# Patient Record
Sex: Female | Born: 1990 | Race: Black or African American | Hispanic: No | Marital: Single | Smoking: Never smoker
Health system: Southern US, Community
[De-identification: ages and names within clinical notes are randomized; demographics above are authoritative.]

## PROBLEM LIST (undated history)

## (undated) DIAGNOSIS — I493 Ventricular premature depolarization: Secondary | ICD-10-CM

## (undated) DIAGNOSIS — K219 Gastro-esophageal reflux disease without esophagitis: Secondary | ICD-10-CM

## (undated) DIAGNOSIS — L309 Dermatitis, unspecified: Secondary | ICD-10-CM

## (undated) DIAGNOSIS — J3501 Chronic tonsillitis: Secondary | ICD-10-CM

## (undated) HISTORY — DX: Gastro-esophageal reflux disease without esophagitis: K21.9

## (undated) HISTORY — PX: WISDOM TOOTH EXTRACTION: SHX21

---
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
## 2011-02-18 ENCOUNTER — Ambulatory Visit (INDEPENDENT_AMBULATORY_CARE_PROVIDER_SITE_OTHER): Payer: BC Managed Care – PPO

## 2011-02-18 DIAGNOSIS — N390 Urinary tract infection, site not specified: Secondary | ICD-10-CM

## 2011-05-15 ENCOUNTER — Encounter: Payer: Self-pay | Admitting: Cardiology

## 2011-05-15 ENCOUNTER — Encounter: Payer: Self-pay | Admitting: Internal Medicine

## 2011-05-23 ENCOUNTER — Other Ambulatory Visit (HOSPITAL_COMMUNITY): Payer: Self-pay | Admitting: Cardiology

## 2011-05-23 DIAGNOSIS — R002 Palpitations: Secondary | ICD-10-CM

## 2011-05-23 DIAGNOSIS — I493 Ventricular premature depolarization: Secondary | ICD-10-CM

## 2011-05-26 ENCOUNTER — Encounter: Payer: Self-pay | Admitting: Internal Medicine

## 2011-05-26 ENCOUNTER — Ambulatory Visit (INDEPENDENT_AMBULATORY_CARE_PROVIDER_SITE_OTHER): Payer: Self-pay | Admitting: Internal Medicine

## 2011-05-26 VITALS — BP 102/70 | HR 86 | Resp 18 | Ht 68.0 in | Wt 159.1 lb

## 2011-05-26 DIAGNOSIS — I493 Ventricular premature depolarization: Secondary | ICD-10-CM | POA: Insufficient documentation

## 2011-05-26 DIAGNOSIS — I4949 Other premature depolarization: Secondary | ICD-10-CM

## 2011-05-26 MED ORDER — NADOLOL 20 MG PO TABS: ORAL_TABLET | ORAL | Status: DC

## 2011-05-26 NOTE — Progress Notes (Signed)
Primary Care Physician: none Referring Physician:  Dr Nat Math is a 21 y.o. female with a h/o recently diagnosed PVCS who presents today for EP consultation.  She reports initially presenting to Dr [facility] office.  Upon being evaluated for tonsillectomy, she was observed to have frequent ventricular ectopy.  He referred her to Dr Mayford Knife.  EKG revealed LBBB inferior axis PVCs.  She had an echo which revealed no significant abnormalities.  She is mostly unaware of her PVCs. She reports rare palpitations at night.  Today, she denies symptoms of chest pain, shortness of breath, orthopnea, PND, lower extremity edema, dizziness, presyncope, or syncope. The patient is tolerating medications without difficulties and is otherwise without complaint today.   Past Medical History  Diagnosis Date  . Premature ventricular beat    Past Surgical History  Procedure Date  . Wisdom tooth extraction     No current outpatient prescriptions on file.    No Known Allergies  History   Social History  . Marital Status: Single    Spouse Name: N/A    Number of Children: N/A  . Years of Education: N/A   Occupational History  . Not on file.   Social History Main Topics  . Smoking status: Never Smoker   . Smokeless tobacco: Not on file  . Alcohol Use: No  . Drug Use: No  . Sexually Active: Not on file   Other Topics Concern  . Not on file   Social History Narrative   Pt lives in [place] with roommates.  Attends [employer] (sophomore).     Family History  Problem Relation Age of Onset  . Sudden death Cousin     maternal cousin had mental retardation, cardiomyopathy and sudden death  She is unaware of any other sudden death or arrhythmias though her paternal history is limited.  ROS- All systems are reviewed and negative except as per the HPI above  Physical Exam: Filed Vitals:   05/26/11 1016  BP: 102/70  Pulse: 86  Resp: 18  Height: 5\' 8"  (1.727 m)  Weight: 159 lb  1.9 oz (72.176 kg)  SpO2: 98%    GEN- The patient is well appearing, alert and oriented x 3 today.   Head- normocephalic, atraumatic Eyes-  Sclera clear, conjunctiva pink Ears- hearing intact Oropharynx- clear Neck- supple, no JVP Lymph- no cervical lymphadenopathy Lungs- Clear to ausculation bilaterally, normal work of breathing Heart- Regular rate and rhythm, no murmurs, rubs or gallops, PMI not laterally displaced GI- soft, NT, ND, + BS Extremities- no clubbing, cyanosis, or edema MS- no significant deformity or atrophy Skin- no rash or lesion Psych- euthymic mood, full affect Neuro- strength and sensation are intact  EKG today reveals sinus rhythm 74 bpm, rsR' otherwise normal ekg, no PVCs today Echo 05/15/11- EF 66%, normal LV/RV size and function No significant valvular disease  GXT 05/15/11- I have 3 ekgs from her GXT which reveal pre-exercise, exercise, and post-exercise. Pre and post exercise, she has sinus rhythm with LBB inferior Axis PVCs noted frequently.  She does not have any pvcs on the ekg during peak exercise and there are no ischemic changes  24 hour holter 05/19/11- sinus rhythm with frequent PVCs (22,501 pvcs), no sustained arrhythmias  Assessment and Plan:

## 2011-05-26 NOTE — Patient Instructions (Signed)
Your physician recommends that you schedule a follow-up appointment in: 6 weeks with Dr Johney Frame   Your physician has recommended you make the following change in your medication:  1) Start Nadolol 20mg  ---take 1/2 tablet daily

## 2011-05-26 NOTE — Assessment & Plan Note (Signed)
Ms Rehm presents today for EP consultation regarding asymptomatic PVCs.  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  There are not closely coupled to preceding t waves and she has never had presyncope, syncope, or sustained ventricular arrhythmias.  Echo is reviewed and reveals no structural heart disease.  EKG is benign.   She has had a maternal cousin who died suddenly, but with a h/o cardiomyopathy and mental retardation.  She is unaware of any other family history of arrhythmias or sudden death. At this point, I have asked the patient and her mother to try to obtain records from her cousin's autopsy for me to review. In the interim, I will initiate nadolol 10mg  daily.  I have encouraged her to avoid caffeine.  She will contact my office if syncope occurs or she develops symptoms with her PVCs.  I have recommended that she proceed with tonsillectomy if medically indicated without further CV workup.  If possible, I would avoid epinephrine with the procedure, though if absolutely necessary then epi is not an absolute contraindication.  I will see her again in 4 weeks for follow-up. She will contact me if problems arise.

## 2011-05-28 DIAGNOSIS — J3501 Chronic tonsillitis: Secondary | ICD-10-CM

## 2011-05-28 HISTORY — DX: Chronic tonsillitis: J35.01

## 2011-05-29 NOTE — H&P (Signed)
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  When there are present, she feels like there is a foreign body in her pharynx.  She sometimes can work them loose, sometimes they come out spontaneously.  Minimal bleeding.  She does have halitosis.  She also has frequent but minor sore throats.  In years past she had occasional strep throat.  PMH: Past Medical History  Diagnosis Date  . Premature ventricular beat     Surg Hx: Past Surgical History  Procedure Date  . Wisdom tooth extraction     FHx:   Family History  Problem Relation Age of Onset  . Sudden death Cousin     maternal cousin had mental retardation, cardiomyopathy and sudden death   SocHx:  reports that she has never smoked. She does not have any smokeless tobacco history on file. She reports that she does not drink alcohol or use illicit drugs.  ALLERGIES: No Known Allergies  No prescriptions prior to admission    No results found for this or any previous visit (from the past 48 hour(s)). No results found.  KGM:[id number]: Feeling tired (fatigue).  No fever, no night sweats, and no recent weight loss. Head: No headache. Eyes: No eye symptoms. Otolaryngeal: No hearing loss, no earache, no tinnitus, and no purulent nasal discharge.  No nasal passage blockage, no snoring, no sneezing, and no hoarseness.  Sore throat. Cardiovascular: No chest pain or discomfort  And Positive for  palpitations. Pulmonary: No dyspnea, no cough, and no wheezing. Gastrointestinal: No dysphagia, no heartburn, no nausea, no abdominal pain, and no melena.  No diarrhea. Genitourinary: No dysuria. Endocrine: No muscle weakness. Musculoskeletal: No calf muscle cramps, no arthralgias, and no soft tissue swelling. Neurological: No dizziness, no fainting, no tingling, and no numbness. Psychological: No anxiety   and no depression. Skin: No rash.   Vital Signs:  BP:106/64,  HR: 63 b/min,  Height: 68.000000 in, Weight: 664.403474 lb, BMI: 23.6 kg/m2,   PHYSICAL EXAM: She is healthy.  Some halitosis.  Mental status is appropriate.  She hears well in conversational speech.  Voice is clear and respirations unlabored through the nose.  The head is atraumatic and neck supple.  Cranial nerves intact.  Ear canals are clear with normal drums.  Anterior nose is moist and patent.  Oral cavity reveals broad mandible and maxilla with excellent teeth and no crowding.  Oropharynx reveals normal soft palate with 2-3+ tonsils.  No visible cryptic debris.  No velopharyngeal insufficiency.  I did not examine nasopharynx or hypopharynx.  Neck without adenopathy.  Lungs are clear to auscultation.   Heart reveals regularly irregular rhythm with a skipped beat every 3rd or 4th beat.   Abdomen is soft and active.   Extremities of normal configuration.   Neurologic testing grossly intact and symmetric.  Studies Reviewed:  Cardiology eval revealed frequent PVC's.  Cleared for surgery.  No epinephrine if possible.    Assessment/Plan . Chronic tonsillitis   (474.00) . Tonsillar calculus   (474.8)  I recommend tonsillectomy, possible adenoidectomy for tonsillolith formation, halitosis, and recurrent minor sore throats.  I talked about the surgery in basic details including risks and complications so she can make in informed consent but also so she can pick an appropriate time in her schedule.  I do think she should get each sore throat cultured to make sure we are not  missing strep.  I will see her back preoperatively.  Roxicet 5-325 MG/5ML Oral Solution;5-10 ml q 4-6h prn severe pain; Qty300; R0; Rx. Hydrocodone-Acetaminophen 7.5-500 MG/15ML Oral Solution;10-20 ml q4-6h prn pain; Qty500; R2; Rx.  Lazarus Salines, Tashena Ibach 05/29/2011, 6:03 PM

## 2011-05-30 ENCOUNTER — Encounter (HOSPITAL_BASED_OUTPATIENT_CLINIC_OR_DEPARTMENT_OTHER)
Admission: RE | Admit: 2011-05-30 | Discharge: 2011-05-30 | Disposition: A | Discharge status: Home / Self Care | Payer: BC Managed Care – PPO | Source: Ambulatory Visit | Attending: Otolaryngology | Admitting: Otolaryngology

## 2011-05-30 ENCOUNTER — Encounter (HOSPITAL_BASED_OUTPATIENT_CLINIC_OR_DEPARTMENT_OTHER): Payer: Self-pay | Admitting: *Deleted

## 2011-05-30 LAB — BASIC METABOLIC PANEL
CO2: 28 mEq/L (ref 19–32)
Calcium: 9.7 mg/dL (ref 8.4–10.5)
Creatinine, Ser: 0.8 mg/dL (ref 0.50–1.10)
GFR calc Af Amer: >90 mL/min (ref >90)
GFR calc non Af Amer: >90 mL/min (ref >90)
Sodium: 136 mEq/L (ref 135–145)

## 2011-05-30 NOTE — Pre-Procedure Instructions (Signed)
Check LMP DOS - unknown by mother

## 2011-05-30 NOTE — Pre-Procedure Instructions (Signed)
To come for BMET 

## 2011-06-02 ENCOUNTER — Ambulatory Visit (HOSPITAL_BASED_OUTPATIENT_CLINIC_OR_DEPARTMENT_OTHER)
Admission: RE | Admit: 2011-06-02 | Discharge: 2011-06-03 | Disposition: A | Discharge status: Home / Self Care | Payer: BC Managed Care – PPO | Source: Ambulatory Visit | Attending: Otolaryngology | Admitting: Otolaryngology

## 2011-06-02 ENCOUNTER — Ambulatory Visit (HOSPITAL_BASED_OUTPATIENT_CLINIC_OR_DEPARTMENT_OTHER): Payer: BC Managed Care – PPO | Admitting: Certified Registered"

## 2011-06-02 ENCOUNTER — Encounter (HOSPITAL_BASED_OUTPATIENT_CLINIC_OR_DEPARTMENT_OTHER)
Admission: RE | Disposition: A | Discharge status: Home / Self Care | Payer: Self-pay | Source: Ambulatory Visit | Attending: Otolaryngology

## 2011-06-02 ENCOUNTER — Institutional Professional Consult (permissible substitution): Payer: Self-pay | Admitting: Cardiology

## 2011-06-02 ENCOUNTER — Encounter (HOSPITAL_BASED_OUTPATIENT_CLINIC_OR_DEPARTMENT_OTHER): Payer: Self-pay | Admitting: Certified Registered"

## 2011-06-02 DIAGNOSIS — J3503 Chronic tonsillitis and adenoiditis: Secondary | ICD-10-CM

## 2011-06-02 DIAGNOSIS — Z01812 Encounter for preprocedural laboratory examination: Secondary | ICD-10-CM | POA: Insufficient documentation

## 2011-06-02 DIAGNOSIS — J039 Acute tonsillitis, unspecified: Secondary | ICD-10-CM | POA: Insufficient documentation

## 2011-06-02 HISTORY — DX: Chronic tonsillitis: J35.01

## 2011-06-02 HISTORY — PX: TONSILLECTOMY AND ADENOIDECTOMY: SHX28

## 2011-06-02 HISTORY — DX: Ventricular premature depolarization: I49.3

## 2011-06-02 HISTORY — DX: Dermatitis, unspecified: L30.9

## 2011-06-02 SURGERY — TONSILLECTOMY AND ADENOIDECTOMY
Anesthesia: General | Site: Throat | Wound class: Clean Contaminated

## 2011-06-02 MED ORDER — SUCCINYLCHOLINE CHLORIDE 20 MG/ML IJ SOLN
INTRAMUSCULAR | Status: DC | PRN
  Administered 2011-06-02: 100 mg via INTRAVENOUS

## 2011-06-02 MED ORDER — MIDAZOLAM HCL 5 MG/5ML IJ SOLN
INTRAMUSCULAR | Status: DC | PRN
  Administered 2011-06-02: 2 mg via INTRAVENOUS

## 2011-06-02 MED ORDER — DEXAMETHASONE SODIUM PHOSPHATE 4 MG/ML IJ SOLN
8.0000 mg | Freq: Once | INTRAMUSCULAR | Status: AC
  Administered 2011-06-02: 8 mg via INTRAVENOUS

## 2011-06-02 MED ORDER — ONDANSETRON HCL 4 MG/2ML IJ SOLN
4.0000 mg | INTRAMUSCULAR | Status: DC | PRN
  Administered 2011-06-02: 4 mg via INTRAVENOUS

## 2011-06-02 MED ORDER — GLYCOPYRROLATE 0.2 MG/ML IJ SOLN
INTRAMUSCULAR | Status: DC | PRN
  Administered 2011-06-02: 0.1 mg via INTRAVENOUS

## 2011-06-02 MED ORDER — POVIDONE-IODINE 10 % EX SOLN
CUTANEOUS | Status: DC | PRN
  Administered 2011-06-02: 1 via TOPICAL

## 2011-06-02 MED ORDER — HYDROMORPHONE HCL PF 1 MG/ML IJ SOLN
0.2500 mg | INTRAMUSCULAR | Status: DC | PRN
  Administered 2011-06-02 (×5): 0.5 mg via INTRAVENOUS
  Administered 2011-06-02: 1 mg via INTRAVENOUS

## 2011-06-02 MED ORDER — PROPOFOL 10 MG/ML IV EMUL
INTRAVENOUS | Status: DC | PRN
  Administered 2011-06-02: 200 mg via INTRAVENOUS

## 2011-06-02 MED ORDER — SODIUM CHLORIDE 0.9 % IV SOLN
INTRAVENOUS | Status: DC
  Administered 2011-06-02 – 2011-06-03 (×3): via INTRAVENOUS

## 2011-06-02 MED ORDER — OXYCODONE-ACETAMINOPHEN 5-325 MG/5ML PO SOLN
10.0000 mL | ORAL | Status: AC | PRN
  Administered 2011-06-02: 10 mL via ORAL

## 2011-06-02 MED ORDER — LACTATED RINGERS IV SOLN
INTRAVENOUS | Status: DC
  Administered 2011-06-02: 10:00:00 via INTRAVENOUS

## 2011-06-02 MED ORDER — ONDANSETRON HCL 4 MG/2ML IJ SOLN
4.0000 mg | Freq: Once | INTRAMUSCULAR | Status: AC | PRN
  Administered 2011-06-02: 4 mg via INTRAVENOUS

## 2011-06-02 MED ORDER — LIDOCAINE HCL (CARDIAC) 20 MG/ML IV SOLN
INTRAVENOUS | Status: DC | PRN
  Administered 2011-06-02: 60 mg via INTRAVENOUS
  Administered 2011-06-02: 30 mg via INTRAVENOUS

## 2011-06-02 MED ORDER — DEXAMETHASONE SODIUM PHOSPHATE 10 MG/ML IJ SOLN: 8.0000 mg | Freq: Once | INTRAMUSCULAR | Status: DC

## 2011-06-02 MED ORDER — SODIUM CHLORIDE 0.9 % IV SOLN
1.0000 g | Freq: Once | INTRAVENOUS | Status: AC
  Administered 2011-06-02: 1 g via INTRAVENOUS

## 2011-06-02 MED ORDER — HYDROCODONE-ACETAMINOPHEN 7.5-325 MG/15ML PO SOLN: 10.0000 mL | Freq: Four times a day (QID) | ORAL | Status: DC | PRN

## 2011-06-02 MED ORDER — ONDANSETRON HCL 4 MG/2ML IJ SOLN
INTRAMUSCULAR | Status: DC | PRN
  Administered 2011-06-02: 4 mg via INTRAVENOUS

## 2011-06-02 MED ORDER — OXYCODONE-ACETAMINOPHEN 5-325 MG/5ML PO SOLN
5.0000 mL | ORAL | Status: DC | PRN
  Administered 2011-06-02 (×2): 10 mL via ORAL

## 2011-06-02 MED ORDER — ONDANSETRON HCL 4 MG PO TABS: 4.0000 mg | ORAL_TABLET | ORAL | Status: DC | PRN

## 2011-06-02 MED ORDER — OXYCODONE HCL 5 MG/5ML PO SOLN
5.0000 mg | ORAL | Status: DC | PRN
  Administered 2011-06-03 (×2): 10 mg via ORAL

## 2011-06-02 MED ORDER — DEXAMETHASONE SODIUM PHOSPHATE 4 MG/ML IJ SOLN
INTRAMUSCULAR | Status: DC | PRN
  Administered 2011-06-02: 10 mg via INTRAVENOUS

## 2011-06-02 MED ORDER — FENTANYL CITRATE 0.05 MG/ML IJ SOLN
INTRAMUSCULAR | Status: DC | PRN
  Administered 2011-06-02: 100 ug via INTRAVENOUS
  Administered 2011-06-02: 50 ug via INTRAVENOUS

## 2011-06-02 SURGICAL SUPPLY — 35 items
CANISTER SUCTION 1200CC (MISCELLANEOUS) ×2 IMPLANT
CATH ROBINSON RED A/P 10FR (CATHETERS) ×1 IMPLANT
CLEANER CAUTERY TIP 5X5 PAD (MISCELLANEOUS) ×1 IMPLANT
CLOTH BEACON ORANGE TIMEOUT ST (SAFETY) ×2 IMPLANT
COAGULATOR SUCT SWTCH 10FR 6 (ELECTROSURGICAL) ×1 IMPLANT
COVER MAYO STAND STRL (DRAPES) ×2 IMPLANT
DECANTER SPIKE VIAL GLASS SM (MISCELLANEOUS) IMPLANT
ELECT COATED BLADE 2.86 ST (ELECTRODE) ×2 IMPLANT
ELECT REM PT RETURN 9FT ADLT (ELECTROSURGICAL) ×2
ELECT REM PT RETURN 9FT PED (ELECTROSURGICAL)
ELECTRODE REM PT RETRN 9FT PED (ELECTROSURGICAL) IMPLANT
ELECTRODE REM PT RTRN 9FT ADLT (ELECTROSURGICAL) IMPLANT
GAUZE SPONGE 4X4 12PLY STRL LF (GAUZE/BANDAGES/DRESSINGS) ×2 IMPLANT
GLOVE BIO SURGEON STRL SZ 6.5 (GLOVE) ×1 IMPLANT
GLOVE ECLIPSE 8.0 STRL XLNG CF (GLOVE) ×2 IMPLANT
GLOVE SURG SS PI 7.0 STRL IVOR (GLOVE) IMPLANT
GOWN PREVENTION PLUS XLARGE (GOWN DISPOSABLE) ×2 IMPLANT
GOWN PREVENTION PLUS XXLARGE (GOWN DISPOSABLE) ×2 IMPLANT
MARKER SKIN DUAL TIP RULER LAB (MISCELLANEOUS) ×2 IMPLANT
NDL SPNL 22GX3.5 QUINCKE BK (NEEDLE) ×1 IMPLANT
NEEDLE SPNL 22GX3.5 QUINCKE BK (NEEDLE) IMPLANT
NS IRRIG 1000ML POUR BTL (IV SOLUTION) ×2 IMPLANT
PAD CLEANER CAUTERY TIP 5X5 (MISCELLANEOUS) ×1
PENCIL FOOT CONTROL (ELECTRODE) ×2 IMPLANT
SHEET MEDIUM DRAPE 40X70 STRL (DRAPES) ×2 IMPLANT
SLEEVE SCD COMPRESS KNEE MED (MISCELLANEOUS) ×1 IMPLANT
SPONGE TONSIL 1 RF SGL (DISPOSABLE) IMPLANT
SPONGE TONSIL 1.25 RF SGL STRG (GAUZE/BANDAGES/DRESSINGS) IMPLANT
SYR BULB 3OZ (MISCELLANEOUS) ×2 IMPLANT
SYR CONTROL 10ML LL (SYRINGE) ×1 IMPLANT
TOWEL OR 17X24 6PK STRL BLUE (TOWEL DISPOSABLE) ×2 IMPLANT
TUBE CONNECTING 20X1/4 (TUBING) ×2 IMPLANT
TUBE SALEM SUMP 12R W/ARV (TUBING) ×1 IMPLANT
TUBE SALEM SUMP 16 FR W/ARV (TUBING) IMPLANT
WATER STERILE IRR 1000ML POUR (IV SOLUTION) ×1 IMPLANT

## 2011-06-02 NOTE — Anesthesia Postprocedure Evaluation (Signed)
  Anesthesia Post-op Note  Patient: Sharon Shaw  Procedure(s) Performed: Procedure(s) (LRB): TONSILLECTOMY AND ADENOIDECTOMY (N/A)  Patient Location: PACU  Anesthesia Type: General  Level of Consciousness: awake and alert   Airway and Oxygen Therapy: Patient Spontanous Breathing  Post-op Pain: moderate  Post-op Assessment: Post-op Vital signs reviewed, Patient's Cardiovascular Status Stable, Respiratory Function Stable, Patent Airway and No signs of Nausea or vomiting  Post-op Vital Signs: Reviewed and stable  Complications: No apparent anesthesia complications

## 2011-06-02 NOTE — Interval H&P Note (Signed)
History and Physical Interval Note:  06/02/2011 8:38 AM  Sharon Shaw  has presented today for surgery, with the diagnosis of chronic tonsillitis  The various methods of treatment have been discussed with the patient and family. After consideration of risks, benefits and other options for treatment, the patient has consented to  Procedure(s) (LRB): TONSILLECTOMY AND ADENOIDECTOMY (N/A) as a surgical intervention .  The patients' history has been re-reviewed, patient re-examined, no change in status, stable for surgery.  I have re-reviewed the patient's chart and labs.  Questions were answered to the patient's satisfaction.     Flo Shanks

## 2011-06-02 NOTE — Anesthesia Procedure Notes (Signed)
Procedure Name: Intubation Date/Time: 06/02/2011 10:14 AM Performed by: Verlan Friends Pre-anesthesia Checklist: Patient identified, Emergency Drugs available, Suction available, Patient being monitored and Timeout performed Patient Re-evaluated:Patient Re-evaluated prior to inductionOxygen Delivery Method: Circle System Utilized Preoxygenation: Pre-oxygenation with 100% oxygen Intubation Type: IV induction Ventilation: Mask ventilation without difficulty Laryngoscope Size: Miller and 3 Grade View: Grade I Tube type: Oral Tube size: 7.0 mm Number of attempts: 1 Airway Equipment and Method: stylet and oral airway Placement Confirmation: ETT inserted through vocal cords under direct vision,  positive ETCO2 and breath sounds checked- equal and bilateral Secured at: 21 cm Tube secured with: Tape Dental Injury: Teeth and Oropharynx as per pre-operative assessment

## 2011-06-02 NOTE — Progress Notes (Signed)
approximaterly 75 ml dark red liquid settled to bottom of emesis; no clots. Top 150 ml clear liquid w/smell of Roxicet medication.

## 2011-06-02 NOTE — Anesthesia Preprocedure Evaluation (Addendum)
Anesthesia Evaluation  Patient identified by MRN, date of birth, ID band Patient awake    Reviewed: Allergy & Precautions, H&P , NPO status , Patient's Chart, lab work & pertinent test results  Airway Mallampati: II      Dental  (+) Teeth Intact   Pulmonary  breath sounds clear to auscultation        Cardiovascular Rhythm:Regular Rate:Normal     Neuro/Psych    GI/Hepatic   Endo/Other    Renal/GU      Musculoskeletal   Abdominal   Peds  Hematology   Anesthesia Other Findings   Reproductive/Obstetrics                           Anesthesia Physical Anesthesia Plan  ASA: II  Anesthesia Plan: General   Post-op Pain Management:    Induction: Intravenous  Airway Management Planned: Oral ETT  Additional Equipment:   Intra-op Plan:   Post-operative Plan: Extubation in OR  Informed Consent: I have reviewed the patients History and Physical, chart, labs and discussed the procedure including the risks, benefits and alternatives for the proposed anesthesia with the patient or authorized representative who has indicated his/her understanding and acceptance.   Dental advisory given  Plan Discussed with: CRNA and Surgeon  Anesthesia Plan Comments: (Chronic tonsillitis with tonsillar stones H/O frequent PVCs, stress test and echo normal cleared for surgery by Dr. Loleta Dicker, MD)       Anesthesia Quick Evaluation

## 2011-06-02 NOTE — Progress Notes (Signed)
06/02/2011 5:55 PM  Ashaunte, [place] [id number]  Post op check    Temp:  [97.4 F (36.3 C)-98.1 F (36.7 C)] 97.8 F (36.6 C) (05/06 1630) Pulse Rate:  [60-106] 73  (05/06 1630) Resp:  [11-22] 16  (05/06 1630) BP: (106-131)/(63-90) 112/67 mmHg (05/06 1630) SpO2:  [92 %-100 %] 99 % (05/06 1719),     Intake/Output Summary (Last 24 hours) at 06/02/11 1755 Last data filed at 06/02/11 1630  Gross per 24 hour  Intake   1100 ml  Output    500 ml  Net    600 ml    Results for orders placed during the hospital encounter of 06/02/11 (from the past 24 hour(s))  POCT HEMOGLOBIN-HEMACUE     Status: Abnormal   Collection Time   06/02/11  9:37 AM      Component Value Range   Hemoglobin 11.9 (*) 12.0 - 15.0 (g/dL)    SUBJECTIVE:  Nurses report reluctant to take po's.  Emesis post oxycodone recently.  Tol. Dilaudid dose.  OBJECTIVE:  Sleeping, but arousable.  IMPRESSION:  Slow progress  PLAN:  Advance po.  Advance activity.  Repeat oxycodone dose after emesis.  Try add'l dose of Decadron 8 mg.  Anticipate nursing D/C in AM.  Lazarus Salines, Zola Button

## 2011-06-02 NOTE — Transfer of Care (Signed)
Immediate Anesthesia Transfer of Care Note  Patient: Sharon Shaw  Procedure(s) Performed: Procedure(s) (LRB): TONSILLECTOMY AND ADENOIDECTOMY (N/A)  Patient Location: PACU  Anesthesia Type: General  Level of Consciousness: awake, alert , oriented and patient cooperative  Airway & Oxygen Therapy: Patient Spontanous Breathing and Patient connected to face mask oxygen  Post-op Assessment: Report given to PACU RN and Post -op Vital signs reviewed and stable  Post vital signs: Reviewed and stable  Complications: No apparent anesthesia complications

## 2011-06-02 NOTE — Op Note (Signed)
06/02/2011  10:44 AM    Barnett Hatter  [id number]   Pre-Op Dx:  Recurrent adenotonsillitis  Post-op Dx: same  Proc: T&A   Surg:  Flo Shanks T MD  Anes:  GOT  EBL:  10 ml  Comp:  none  Findings:  Congested inferior turbinates.  Nl palate. 2-3+ tonsils.  50% residual adenoids.  Procedure:  With the patient in a comfortable supine position,  general orotracheal anesthesia was induced without difficulty.  At an appropriate level, the patient was turned 90 away from anesthesia and placed in Trendelenburg.  A clean preparation and draping was accomplished.  Taking care to protect lips, teeth, and endotracheal tube, the Crowe-Davis mouth gag was introduced, expanded for visualization, and suspended from the Mayo stand in the standard fashion.  The findings were as described above.  Palate  retractor  and mirror were used to examine the nasopharynx with the findings as described above.   Anterior nose was examined with a nasal speculum with the findings as described above.   A red rubber  Catheter was passed through the nose and out the mouth as a palate retractor.  Using the suction cautery, the adenoid pad was ablated.  Hemostasis was observed.  Beginning on the  RIGHT side, the tonsil was grasped and retracted medially.  The mucosa over the anterior and superior poles was coagulated and then cut down to the capsule of the tonsil.  Using the cautery tip as a blunt dissector, the tonsil was dissected from its muscular fossa from anterior to posterior and from superior to inferior.  Fibrous bands were lysed as necessary.  Crossing vessels were coagulated as identified.  The tonsil was removed in its entirety as determined by examination of both tonsil and fossa.  A small additional quantity of cautery rendered the fossa hemostatic.    After completing the 1st tonsillectomy, the 2nd one was performed in identical fashion.  After completing both tonsillectomies and rendering the  oropharynx hemostatic, the nasopharynx was re-inspected. Hemostasis was observed.      At this point the palate retractor and mouthgag were relaxed for several minutes.  Upon reexpansion,  Hemostasis was observed at all sites.  The palate retractor and mouth gag were relaxed and removed.  The dental status was intact.  At this point the procedure was completed.  The patient was returned to anesthesia, awakened, extubated, and transferred to recovery in stable condition.   Dispo:  OR to PACU.   Will observe for six hours, overnight if necessary and then discharged to home in care of family.  Plan:  Analgesia, hydration, limited activity for two weeks.  Advance diet as comfortable.  Return to school or work at 10 days.  Cephus Richer  MD.

## 2011-06-02 NOTE — Discharge Instructions (Signed)
Tonsillectomy, Information Before and After A tonsillectomy is a surgery to remove the tonsils. This is often done because non-surgical (conservative) treatments have failed to help the problem. This procedure is also sometimes done if a tonsil seems to be abnormally large for no reason. Tonsils are lumps of lymph tissues at the back of the throat. Because tonsils can collect debris, they can become infected. Tonsils help fight infections. Removal of the tonsils does not cause an increased risk of infections. LET YOUR CAREGIVER KNOW ABOUT:  Allergies to food or medicine.   Medicines taken, including vitamins, herbs, eyedrops, over-the-counter medicines, and creams.   Use of steroids (by mouth or creams).   Previous problems with anesthetics or numbing medicines.   History of bleeding problems or blood clots.   Previous surgery.   Other health problems, including diabetes and kidney problems.   Possibility of pregnancy, if this applies.  BEFORE THE PROCEDURE You should be present 60 minutes prior to your procedure or as directed.  AFTER THE PROCEDURE After surgery, you will be taken to a recovery area for close monitoring. Once you are awake, stable, and taking fluids well, you will be allowed to go home. Throat soreness may continue for 2 to 3 weeks. There also may be pain felt in the ear that causes an earache. HOME CARE INSTRUCTIONS   Only take over-the-counter or prescription medicines for pain, discomfort, or fever as directed by your caregiver. Do not take aspirin. This increases the possibilities for bleeding.   Obtain proper rest. You may feel worn out and tired for a while.   Because of the sore throat and swelling, your appetite may be poor. Soft and cold foods such as ice cream, frozen ice pops, and cold drinks are usually tolerated best.   Avoid mouthwashes and gargling.   Avoid people with upper respiratory infections, such as colds or sore throats.   An ice pack  applied to your neck may help with discomfort and keep swelling down.  SEEK MEDICAL CARE IF:   There is increased bleeding, if you vomit, or cough or spit up bright red blood.   Increasing pain that is not controlled with medications.   An unexplained oral temperature above 102 F (38.9 C) develops.   You feel lightheaded or have a fainting spell.  SEEK IMMEDIATE MEDICAL CARE IF:   You develop a rash.   You have difficulty breathing.   You have any other allergic problems.  Document Released: 04/27/2000 Document Revised: 01/02/2011 Document Reviewed: 11/30/2006 [facility] Patient Information 2012 [place], [place].    Post Anesthesia Home Care Instructions  Activity: Get plenty of rest for the remainder of the day. A responsible adult should stay with you for 24 hours following the procedure.  For the next 24 hours, DO NOT: -Drive a car -Advertising copywriter -Drink alcoholic beverages -Take any medication unless instructed by your physician -Make any legal decisions or sign important papers.  Meals: Start with liquid foods such as gelatin or soup. Progress to regular foods as tolerated. Avoid greasy, spicy, heavy foods. If nausea and/or vomiting occur, drink only clear liquids until the nausea and/or vomiting subsides. Call your physician if vomiting continues.  Special Instructions/Symptoms: Your throat may feel dry or sore from the anesthesia or the breathing tube placed in your throat during surgery. If this causes discomfort, gargle with warm salt water. The discomfort should disappear within 24 hours.    Call your surgeon if you experience:   1.  Fever over  101.0. 2.  Inability to urinate. 3.  Nausea and/or vomiting. 4.  Extreme swelling or bruising at the surgical site. 5.  Continued bleeding from the incision. 6.  Increased pain, redness or drainage from the incision. 7.  Problems related to your pain medication.

## 2011-06-03 ENCOUNTER — Inpatient Hospital Stay (HOSPITAL_COMMUNITY)
Admission: RE | Admit: 2011-06-03 | Discharge: 2011-06-03 | Payer: Self-pay | Source: Ambulatory Visit | Attending: Cardiology | Admitting: Cardiology

## 2011-06-03 ENCOUNTER — Encounter (HOSPITAL_BASED_OUTPATIENT_CLINIC_OR_DEPARTMENT_OTHER): Payer: Self-pay | Admitting: Otolaryngology

## 2011-06-04 ENCOUNTER — Emergency Department (HOSPITAL_COMMUNITY)
Admission: EM | Admit: 2011-06-04 | Discharge: 2011-06-04 | Disposition: A | Discharge status: Home / Self Care | Payer: BC Managed Care – PPO | Attending: Emergency Medicine | Admitting: Emergency Medicine

## 2011-06-04 ENCOUNTER — Emergency Department (HOSPITAL_COMMUNITY): Payer: BC Managed Care – PPO

## 2011-06-04 ENCOUNTER — Encounter (HOSPITAL_COMMUNITY): Payer: Self-pay | Admitting: *Deleted

## 2011-06-04 DIAGNOSIS — R0602 Shortness of breath: Secondary | ICD-10-CM | POA: Insufficient documentation

## 2011-06-04 DIAGNOSIS — R070 Pain in throat: Secondary | ICD-10-CM | POA: Insufficient documentation

## 2011-06-04 DIAGNOSIS — R110 Nausea: Secondary | ICD-10-CM | POA: Insufficient documentation

## 2011-06-04 DIAGNOSIS — G8918 Other acute postprocedural pain: Secondary | ICD-10-CM

## 2011-06-04 MED ORDER — IOHEXOL 300 MG/ML  SOLN
70.0000 mL | Freq: Once | INTRAMUSCULAR | Status: AC | PRN
  Administered 2011-06-04: 70 mL via INTRAVENOUS

## 2011-06-04 MED ORDER — MORPHINE SULFATE 4 MG/ML IJ SOLN
4.0000 mg | Freq: Once | INTRAMUSCULAR | Status: AC
  Administered 2011-06-04: 4 mg via INTRAVENOUS
  Filled 2011-06-04: qty 1

## 2011-06-04 MED ORDER — SODIUM CHLORIDE 0.9 % IV BOLUS (SEPSIS)
1000.0000 mL | Freq: Once | INTRAVENOUS | Status: AC
  Administered 2011-06-04: 1000 mL via INTRAVENOUS

## 2011-06-04 MED ORDER — DEXTROSE 5 % IV SOLN
1.0000 g | INTRAVENOUS | Status: DC
  Administered 2011-06-04: 1 g via INTRAVENOUS
  Filled 2011-06-04: qty 10
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
## 2011-06-04 NOTE — ED Notes (Signed)
PT returned from CT. No signs of distress; still refusing communication.

## 2011-06-04 NOTE — ED Notes (Signed)
Pt here for sob and nausea.  Had  Tonsils removed on 06/02/11.  Pt denies bleeding, but states difficulty swallowing, sob and nausea.  VS wnl.  Pt has called surgeon [facility] and he stated to come to ED.
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
## 2011-06-19 ENCOUNTER — Encounter: Payer: Self-pay | Admitting: Internal Medicine

## 2011-07-09 ENCOUNTER — Ambulatory Visit: Payer: Self-pay | Admitting: Internal Medicine

## 2011-07-23 ENCOUNTER — Encounter: Payer: Self-pay | Admitting: Internal Medicine

## 2011-08-05 ENCOUNTER — Ambulatory Visit: Payer: BC Managed Care – PPO

## 2011-08-05 ENCOUNTER — Ambulatory Visit (INDEPENDENT_AMBULATORY_CARE_PROVIDER_SITE_OTHER): Payer: BC Managed Care – PPO | Admitting: Family Medicine

## 2011-08-05 VITALS — BP 108/65 | HR 86 | Temp 97.9°F | Resp 16 | Ht 67.0 in | Wt 156.0 lb

## 2011-08-05 DIAGNOSIS — R112 Nausea with vomiting, unspecified: Secondary | ICD-10-CM

## 2011-08-05 DIAGNOSIS — R110 Nausea: Secondary | ICD-10-CM

## 2011-08-05 DIAGNOSIS — K219 Gastro-esophageal reflux disease without esophagitis: Secondary | ICD-10-CM

## 2011-08-05 LAB — POCT URINALYSIS DIPSTICK
Blood, UA: NEGATIVE
Glucose, UA: NEGATIVE
Nitrite, UA: NEGATIVE
Urobilinogen, UA: 0.2

## 2011-08-05 LAB — POCT UA - MICROSCOPIC ONLY
Casts, Ur, LPF, POC: NEGATIVE
Crystals, Ur, HPF, POC: NEGATIVE

## 2011-08-05 LAB — POCT URINE PREGNANCY: Preg Test, Ur: NEGATIVE

## 2011-08-05 MED ORDER — PROMETHAZINE HCL 12.5 MG PO TABS: 12.5000 mg | ORAL_TABLET | Freq: Three times a day (TID) | ORAL | Status: DC | PRN

## 2011-08-05 MED ORDER — ESOMEPRAZOLE MAGNESIUM 40 MG PO CPDR: 40.0000 mg | DELAYED_RELEASE_CAPSULE | Freq: Every day | ORAL | Status: DC

## 2011-08-05 MED ORDER — OMEPRAZOLE 20 MG PO CPDR: 20.0000 mg | DELAYED_RELEASE_CAPSULE | Freq: Every day | ORAL | Status: DC

## 2011-08-05 NOTE — Progress Notes (Signed)
Patient ID: Sharon Shaw, female   DOB: March 19, 1990, 21 y.o.   MRN: [id number] Sharon Shaw is a 21 y.o. female who presents to Urgent Care today for nausea and vomiting:  1.  N/V:  For past 3 weeks has been experiencing nausea after meals.  None today.  No abdominal pain.  No fevers or chills.  No actual vomiting, just "queasiness" that lasts for about 30 minutes and then resolves.  Currently sexually active but endorses condom use.  Not on birth control currently.  History of GERD 2 years ago, treated with Omeprazole, resolved.  No problems until about 3 weeks ago.    PMH reviewed.  ROS as above otherwise neg.  No chest pain, palpitations, SOB, Fever, Chills, Abd pain.  Medications reviewed. Current Outpatient Prescriptions  Medication Sig Dispense Refill  . nadolol (CORGARD) 20 MG tablet Take 10 mg by mouth daily.        Exam:  BP 108/65  Pulse 86  Temp 97.9 F (36.6 C)  Resp 16  Ht 5\' 7"  (1.702 m)  Wt 156 lb (70.761 kg)  BMI 24.43 kg/m2  LMP 07/26/2011 Gen: Well NAD HEENT: EOMI,  MMM Lungs: CTABL Nl WOB Heart: RRR no MRG Abd: NABS, NT, ND Exts: Non edematous BL  LE, warm and well perfused.   Results for orders placed in visit on 08/05/11  POCT URINE PREGNANCY      Component Value Range   Preg Test, Ur Negative    POCT URINALYSIS DIPSTICK      Component Value Range   Color, UA yellow     Clarity, UA clear     Glucose, UA neg     Bilirubin, UA neg     Ketones, UA neg     Spec Grav, UA 1.010     Blood, UA neg     pH, UA 5.5     Protein, UA neg     Urobilinogen, UA 0.2     Nitrite, UA neg     Leukocytes, UA Negative    POCT UA - MICROSCOPIC ONLY      Component Value Range   WBC, Ur, HPF, POC 1-3     RBC, urine, microscopic 0-2     Bacteria, U Microscopic trace     Mucus, UA neg     Epithelial cells, urine per micros 1-3     Crystals, Ur, HPF, POC neg     Casts, Ur, LPF, POC neg     Yeast, UA neg       Assessment and Plan:  1.  Nausea:   Likely GERD symptoms.  Plan to treat with Nexium as she is worried Omeprazole will no longer work.  Also provided prn prescription of Phenergan to use until Nexium has a chance to work.  No red flags on exam or by history.  Pregnancy test negative.  UA negative.  No abdomen pain, eating well, no anorexia, no signs/symptoms of acute abdominal process.  I provided the patient with explicit warnings and red flags that would prompt return to clinic or the ED.

## 2011-08-05 NOTE — Patient Instructions (Addendum)
Take the Omeprazole daily for the next several weeks to help with acid reduction.  Take the Phenergan if you have any bad nausea or vomiting.    If you haven't had any relief in next week or so, please come back and see [place].  If you have any fevers, chills, abdominal pain please come back or go to the ER.

## 2011-11-11 ENCOUNTER — Ambulatory Visit (INDEPENDENT_AMBULATORY_CARE_PROVIDER_SITE_OTHER): Payer: BC Managed Care – PPO | Admitting: Physician Assistant

## 2011-11-11 VITALS — BP 112/66 | HR 81 | Temp 97.7°F | Resp 16 | Ht 67.0 in | Wt 160.0 lb

## 2011-11-11 DIAGNOSIS — L293 Anogenital pruritus, unspecified: Secondary | ICD-10-CM

## 2011-11-11 DIAGNOSIS — N760 Acute vaginitis: Secondary | ICD-10-CM

## 2011-11-11 DIAGNOSIS — N898 Other specified noninflammatory disorders of vagina: Secondary | ICD-10-CM

## 2011-11-11 LAB — POCT WET PREP WITH KOH: Trichomonas, UA: NEGATIVE

## 2011-11-11 MED ORDER — FLUCONAZOLE 150 MG PO TABS: 150.0000 mg | ORAL_TABLET | Freq: Once | ORAL | Status: DC

## 2011-11-11 NOTE — Progress Notes (Signed)
  Subjective:    Patient ID: Sharon Shaw, female    DOB: [birth year], 21 y.o.   MRN: [id number]  HPI    Review of Systems     Objective:   Physical Exam        Assessment & Plan:  I was involved and agree with the above assessment and plan of this patient.

## 2011-11-11 NOTE — Progress Notes (Signed)
  Subjective:    Patient ID: Sharon Shaw, female    DOB: 12/14/1990, 21 y.o.   MRN: [id number]  HPI  Sharon Shaw is a 21 yr old female complaining of what she thinks is a yeast infection.  Has experienced vaginal itching since Sunday as well as thick white vaginal discharge.  She has had no urinary symptoms.  No recent antibiotics.  She is sexually active with one partner.  She is not using any form of contraception.  She has no concern for STI but would like [place] to test for gonorrhea and chlamydia today.  Period started today.     Review of Systems  All other systems reviewed and are negative.       Objective:   Physical Exam  Constitutional: She appears well-developed and well-nourished. No distress.  Cardiovascular: Normal rate, regular rhythm and normal heart sounds.   Pulmonary/Chest: Breath sounds normal.  Abdominal: Soft. Bowel sounds are normal. There is no tenderness. There is no rebound, no guarding and no CVA tenderness.  Genitourinary:       Labia normal, copious menstrual blood in the vagina, no discharge noted    Filed Vitals:   11/11/11 0846  BP: 112/66  Pulse: 81  Temp: 97.7 F (36.5 C)  Resp: 16   Results for orders placed in visit on 11/11/11  POCT WET PREP WITH KOH      Component Value Range   Trichomonas, UA Negative     Clue Cells Wet Prep HPF POC 0-1     Epithelial Wet Prep HPF POC 0-1     Yeast Wet Prep HPF POC positive     Bacteria Wet Prep HPF POC trace     RBC Wet Prep HPF POC TNTC     WBC Wet Prep HPF POC 3-8     KOH Prep POC Positive           Assessment & Plan:   1. Vaginitis  POCT Wet Prep with KOH, GC/chlamydia probe amp, genital, fluconazole (DIFLUCAN) 150 MG tablet  2. Vaginal itching    3. White Vaginal Discharge     Sharon Shaw is a 21 yr old female complaining of symptoms of a yeast infection.  Due to the amount of menstrual blood in the vagina, it was difficult to appreciate any vaginal discharge.  Wet prep was  also obscured by the presence of menstrual blood.  Given her symptoms of vaginal itching and thick white discharge, will treat as candidiasis with Diflucan.  Instructed patient to RTC after her period if she continues to experience symptoms.  Also counseled patient on the need for contraception.  She will RTC to discuss this.

## 2011-11-11 NOTE — Patient Instructions (Addendum)
Candida Infection, Adult  A candida infection (also called yeast, fungus and Monilia infection) is an overgrowth of yeast that can occur anywhere on the body. A yeast infection commonly occurs in warm, moist body areas. Usually, the infection remains localized but can spread to become a systemic infection. A yeast infection may be a sign of a more severe disease such as diabetes, leukemia, or AIDS.  A yeast infection can occur in both men and women. In women, Candida vaginitis is a vaginal infection. It is one of the most common causes of vaginitis. Men usually do not have symptoms or know they have an infection until other problems develop. Men may find out they have a yeast infection because their sex partner has a yeast infection. Uncircumcised men are more likely to get a yeast infection than circumcised men. This is because the uncircumcised glans is not exposed to air and does not remain as dry as that of a circumcised glans. Older adults may develop yeast infections around dentures.  CAUSES   Women   Antibiotics.   Steroid medication taken for a long time.   Being overweight (obese).   Diabetes.   Poor immune condition.   Certain serious medical conditions.   Immune suppressive medications for organ transplant patients.   Chemotherapy.   Pregnancy.   Menstration.   Stress and fatigue.   Intravenous drug use.   Oral contraceptives.   Wearing tight-fitting clothes in the crotch area.   Catching it from a sex partner who has a yeast infection.   Spermicide.   Intravenous, urinary, or other catheters.  Men   Catching it from a sex partner who has a yeast infection.   Having oral or anal sex with a person who has the infection.   Spermicide.   Diabetes.   Antibiotics.   Poor immune system.   Medications that suppress the immune system.   Intravenous drug use.   Intravenous, urinary, or other catheters.  SYMPTOMS   Women   Thick, white vaginal discharge.   Vaginal itching.   Redness and  swelling in and around the vagina.   Irritation of the lips of the vagina and perineum.   Blisters on the vaginal lips and perineum.   Painful sexual intercourse.   Low blood sugar (hypoglycemia).   Painful urination.   Bladder infections.   Intestinal problems such as constipation, indigestion, bad breath, bloating, increase in gas, diarrhea, or loose stools.  Men   Men may develop intestinal problems such as constipation, indigestion, bad breath, bloating, increase in gas, diarrhea, or loose stools.   Dry, cracked skin on the penis with itching or discomfort.   Jock itch.   Dry, flaky skin.   Athlete's foot.   Hypoglycemia.  DIAGNOSIS   Women   A history and an exam are performed.   The discharge may be examined under a microscope.   A culture may be taken of the discharge.  Men   A history and an exam are performed.   Any discharge from the penis or areas of cracked skin will be looked at under the microscope and cultured.   Stool samples may be cultured.  TREATMENT   Women   Vaginal antifungal suppositories and creams.   Medicated creams to decrease irritation and itching on the outside of the vagina.   Warm compresses to the perineal area to decrease swelling and discomfort.   Oral antifungal medications.   Medicated vaginal suppositories or cream for repeated or recurrent infections.     Wash and dry the irritation areas before applying the cream.   Eating yogurt with lactobacillus may help with prevention and treatment.   Sometimes painting the vagina with gentian violet solution may help if creams and suppositories do not work.  Men   Antifungal creams and oral antifungal medications.   Sometimes treatment must continue for 30 days after the symptoms go away to prevent recurrence.  HOME CARE INSTRUCTIONS   Women   Use cotton underwear and avoid tight-fitting clothing.   Avoid colored, scented toilet paper and deodorant tampons or pads.   Do not douche.   Keep your diabetes  under control.   Finish all the prescribed medications.   Keep your skin clean and dry.   Consume milk or yogurt with lactobacillus active culture regularly. If you get frequent yeast infections and think that is what the infection is, there are over-the-counter medications that you can get. If the infection does not show healing in 3 days, talk to your caregiver.   Tell your sex partner you have a yeast infection. Your partner may need treatment also, especially if your infection does not clear up or recurs.  Men   Keep your skin clean and dry.   Keep your diabetes under control.   Finish all prescribed medications.   Tell your sex partner that you have a yeast infection so they can be treated if necessary.  SEEK MEDICAL CARE IF:    Your symptoms do not clear up or worsen in one week after treatment.   You have an oral temperature above 102 F (38.9 C).   You have trouble swallowing or eating for a prolonged time.   You develop blisters on and around your vagina.   You develop vaginal bleeding and it is not your menstrual period.   You develop abdominal pain.   You develop intestinal problems as mentioned above.   You get weak or lightheaded.   You have painful or increased urination.   You have pain during sexual intercourse.  MAKE SURE YOU:    Understand these instructions.   Will watch your condition.   Will get help right away if you are not doing well or get worse.  Document Released: 02/21/2004 Document Revised: 04/07/2011 Document Reviewed: 06/04/2009  Exit[employer] Patient Information 2013 [employer].

## 2011-11-12 LAB — GC/CHLAMYDIA PROBE AMP, GENITAL
Chlamydia, DNA Probe: NEGATIVE
GC Probe Amp, Genital: NEGATIVE

## 2011-12-23 ENCOUNTER — Other Ambulatory Visit (HOSPITAL_COMMUNITY)
Admission: RE | Admit: 2011-12-23 | Discharge: 2011-12-23 | Disposition: A | Discharge status: Home / Self Care | Payer: BC Managed Care – PPO | Source: Ambulatory Visit | Attending: Family Medicine | Admitting: Family Medicine

## 2011-12-23 DIAGNOSIS — Z124 Encounter for screening for malignant neoplasm of cervix: Secondary | ICD-10-CM | POA: Insufficient documentation

## 2012-10-12 ENCOUNTER — Encounter (HOSPITAL_COMMUNITY): Payer: Self-pay | Admitting: *Deleted

## 2012-10-12 ENCOUNTER — Emergency Department (HOSPITAL_COMMUNITY)
Admission: EM | Admit: 2012-10-12 | Discharge: 2012-10-12 | Disposition: A | Discharge status: Home / Self Care | Payer: BC Managed Care – PPO | Attending: Emergency Medicine | Admitting: Emergency Medicine

## 2012-10-12 ENCOUNTER — Emergency Department (HOSPITAL_COMMUNITY): Payer: BC Managed Care – PPO

## 2012-10-12 DIAGNOSIS — R000 Tachycardia, unspecified: Secondary | ICD-10-CM | POA: Insufficient documentation

## 2012-10-12 DIAGNOSIS — R0789 Other chest pain: Secondary | ICD-10-CM | POA: Insufficient documentation

## 2012-10-12 DIAGNOSIS — L259 Unspecified contact dermatitis, unspecified cause: Secondary | ICD-10-CM | POA: Insufficient documentation

## 2012-10-12 DIAGNOSIS — J3501 Chronic tonsillitis: Secondary | ICD-10-CM | POA: Insufficient documentation

## 2012-10-12 DIAGNOSIS — K219 Gastro-esophageal reflux disease without esophagitis: Secondary | ICD-10-CM | POA: Insufficient documentation

## 2012-10-12 DIAGNOSIS — M940 Chondrocostal junction syndrome [Tietze]: Secondary | ICD-10-CM | POA: Insufficient documentation

## 2012-10-12 DIAGNOSIS — R10816 Epigastric abdominal tenderness: Secondary | ICD-10-CM | POA: Insufficient documentation

## 2012-10-12 DIAGNOSIS — R0602 Shortness of breath: Secondary | ICD-10-CM | POA: Insufficient documentation

## 2012-10-12 DIAGNOSIS — Z8679 Personal history of other diseases of the circulatory system: Secondary | ICD-10-CM | POA: Insufficient documentation

## 2012-10-12 LAB — BASIC METABOLIC PANEL
Chloride: 103 mEq/L (ref 96–112)
GFR calc Af Amer: >90 mL/min (ref >90)
GFR calc non Af Amer: 83 mL/min — ABNORMAL LOW (ref >90)
Glucose, Bld: 114 mg/dL — ABNORMAL HIGH (ref 70–99)
Potassium: 3.2 mEq/L — ABNORMAL LOW (ref 3.5–5.1)
Sodium: 140 mEq/L (ref 135–145)

## 2012-10-12 LAB — CBC
HCT: 36.5 % (ref 36.0–46.0)
Hemoglobin: 13.3 g/dL (ref 12.0–15.0)
RBC: 3.92 MIL/uL (ref 3.87–5.11)

## 2012-10-12 LAB — POCT I-STAT TROPONIN I: Troponin i, poc: 0 ng/mL (ref 0.00–0.08)

## 2012-10-12 MED ORDER — IBUPROFEN 800 MG PO TABS: 800.0000 mg | ORAL_TABLET | Freq: Three times a day (TID) | ORAL | Status: DC | PRN

## 2012-10-12 MED ORDER — GI COCKTAIL ~~LOC~~
30.0000 mL | Freq: Once | ORAL | Status: DC
  Filled 2012-10-12: qty 30

## 2012-10-12 MED ORDER — KETOROLAC TROMETHAMINE 60 MG/2ML IM SOLN
60.0000 mg | Freq: Once | INTRAMUSCULAR | Status: AC
  Administered 2012-10-12: 60 mg via INTRAMUSCULAR
  Filled 2012-10-12: qty 2

## 2012-10-12 MED ORDER — PANTOPRAZOLE SODIUM 40 MG PO TBEC
40.0000 mg | DELAYED_RELEASE_TABLET | Freq: Once | ORAL | Status: DC
  Filled 2012-10-12: qty 1

## 2012-10-12 MED ORDER — HYDROCODONE-ACETAMINOPHEN 5-325 MG PO TABS: 1.0000 | ORAL_TABLET | Freq: Four times a day (QID) | ORAL | Status: DC | PRN

## 2012-10-12 NOTE — ED Notes (Signed)
Family at bedside. 

## 2012-10-12 NOTE — ED Provider Notes (Signed)
CSN: [id number]     Arrival date & time 10/12/12  1801 History   First MD Initiated Contact with Patient 10/12/12 1854     Chief Complaint  Patient presents with  . Chest Pain  . Shortness of Breath   (Consider location/radiation/quality/duration/timing/severity/associated sxs/prior Treatment) HPI 72 YOF with history of PVC's presents to the emergency department with 3 week history of chest pain and shortness of breath. Chest pain is a constant tightness/pressure 7/10 which is worsened 20-30 minutes after eating and when lying down in the epigastric and inframammary folds. Shortness of breath is at rest, but worse during physical exertion. Saw cardiologist last week for sx who scheduled her for an echocardiogram at the end of the month. Pt denies any history of asthma or experiencing these symptoms before. Has tried antacids in the past for heartburn, but they did not improve her symptoms. Denies cough, nasal and sinus congestion, fever, chills, headaches, visual changes, dizziness, syncope, abdominal pain, nausea, vomiting, constipation, diarrhea, urinary symptoms, bowel changes. LMP was 10/05/12.  Past Medical History  Diagnosis Date  . PVCs (premature ventricular contractions)   . Eczema   . Chronic tonsillitis 05/2011    mother denies sx. of sleep apnea  . GERD (gastroesophageal reflux disease)    Past Surgical History  Procedure Laterality Date  . Wisdom tooth extraction    . Tonsillectomy and adenoidectomy  06/02/2011    Procedure: TONSILLECTOMY AND ADENOIDECTOMY;  Surgeon: Flo Shanks, MD;  Location: [facility];  Service: ENT;  Laterality: N/A;  tonsilectomy and adenoid abliteration   Family History  Problem Relation Age of Onset  . Sudden death Cousin     maternal cousin had mental retardation, cardiomyopathy and sudden death  . Hypertension Maternal Grandmother   . Cancer Maternal Grandfather    History  Substance Use Topics  . Smoking status: Never Smoker   .  Smokeless tobacco: Never Used  . Alcohol Use: 0.0 oz/week     Comment: occasionally   OB History   Grav Para Term Preterm Abortions TAB SAB Ect Mult Living                 Review of Systems All other systems negative except as documented in the HPI. All pertinent positives and negatives as reviewed in the HPI.  Allergies  Review of patient's allergies indicates no known allergies.  Home Medications  No current outpatient prescriptions on file. BP 132/80  Pulse 102  Temp(Src) 98 F (36.7 C) (Oral)  Resp 18  Ht 5\' 7"  (1.702 m)  Wt 160 lb (72.576 kg)  BMI 25.05 kg/m2  SpO2 98%  LMP 10/05/2012 Physical Exam  Nursing note and vitals reviewed. Constitutional: She is oriented to person, place, and time. She appears well-developed and well-nourished. She is active.  Non-toxic appearance. She does not have a sickly appearance. She does not appear ill. No distress.  HENT:  Head: Normocephalic and atraumatic.  Eyes: Pupils are equal, round, and reactive to light.  Neck: Normal range of motion.  Cardiovascular: Regular rhythm and normal heart sounds.  Tachycardia present.   Pulmonary/Chest: Effort normal and breath sounds normal. She exhibits tenderness (tender to touch over epigastric area). She exhibits no bony tenderness and no laceration.    Abdominal: Soft. Normal appearance and bowel sounds are normal. There is tenderness in the epigastric area.  Neurological: She is alert and oriented to person, place, and time.  Psychiatric: She has a normal mood and affect. Her speech is normal  and behavior is normal. Judgment and thought content normal.    ED Course  Procedures (including critical care time) Labs Review Labs Reviewed  BASIC METABOLIC PANEL - Abnormal; Notable for the following:    Potassium 3.2 (*)    Glucose, Bld 114 (*)    GFR calc non Af Amer 83 (*)    All other components within normal limits  CBC - Abnormal; Notable for the following:    MCHC 36.4 (*)    All  other components within normal limits  PRO B NATRIURETIC PEPTIDE  POCT I-STAT TROPONIN I   Imaging Review Dg Chest 2 View  10/12/2012   CLINICAL DATA:  Chest pain and shortness of breast.  EXAM: CHEST  2 VIEW  COMPARISON:  PA and lateral chest 06/04/2011.  FINDINGS: The heart size and mediastinal contours are within normal limits. Both lungs are clear. The visualized skeletal structures are unremarkable.  IMPRESSION: No active cardiopulmonary disease.   Electronically Signed   By: Drusilla Kanner M.D.   On: 10/12/2012 19:31    MDM   Patient is at a low risk for an acute cardiovascular event. Low suspicion for a pulmonary infection given normal vitals, normal pulmonary exam, and normal CXR.  Patient is PERC negative.  Had a long discussion with her and the mother about her symptoms have this seems less likely to be cardiac or pulmonary.  The patient is in no acute distress sitting in the bed.  The patient and the mother were advised to return here as needed    Carlyle Dolly, PA-C 10/13/12 0140

## 2012-10-12 NOTE — ED Notes (Signed)
Reports mid and left side chest tightness/pressure x 3 weeks. Having sob with occ cough. No acute distress noted at triage, ekg done.

## 2012-10-12 NOTE — ED Notes (Signed)
Patient reports to the ED with complaints of pain in the chest x3 weeks

## 2012-10-14 NOTE — ED Provider Notes (Signed)
Medical screening examination/treatment/procedure(s) were conducted as a shared visit with non-physician practitioner(s) and myself.  I personally evaluated the patient during the encounter.  Patient is low risk for acute coronary syndrome or pulmonary embolus  Donnetta Hutching, MD 10/14/12 [phone]

## 2012-12-31 ENCOUNTER — Other Ambulatory Visit: Payer: Self-pay | Admitting: Physician Assistant

## 2013-04-08 IMAGING — CT CT NECK W/ CM
4 of 5 series · 16 of 33 positions shown, 18 images · IV contrast (APPLIED)
Comparison: None.

CLINICAL DATA: Pharyngeal exudate

CT NECK WITH CONTRAST
TECHNIQUE: Multidetector CT imaging of the neck was performed with
intravenous contrast.
Contrast: 70mL OMNIPAQUE IOHEXOL 300 MG/ML  SOLN

[Series 3: st neck 2.0 b31s · axial · 0.35mm/px · z∈[+976,+1112]mm · 4 of 114 slices shown, 5 images]
[im 23/114  soft-tissue]
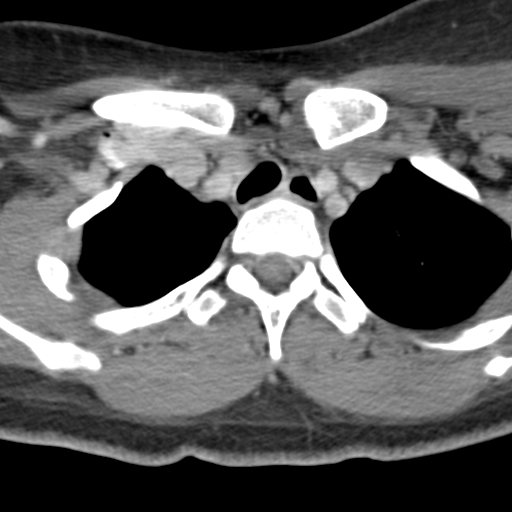
[im 23/114  bone]
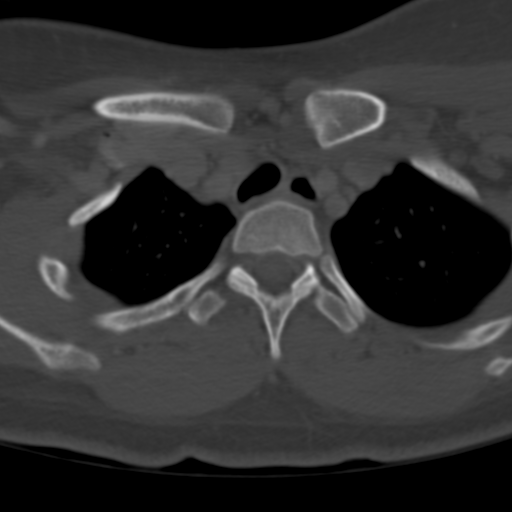
[im 46/114  bone]
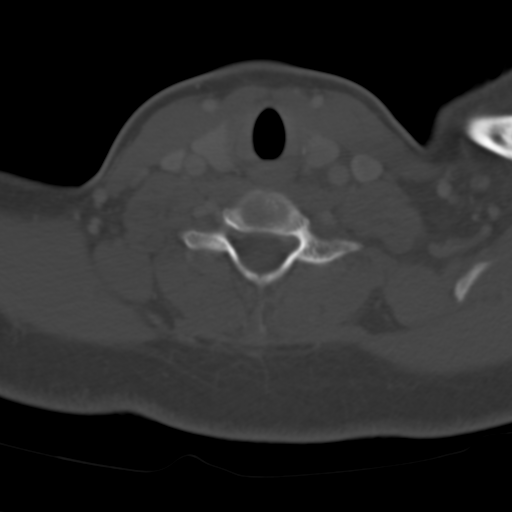
[im 68/114  bone]
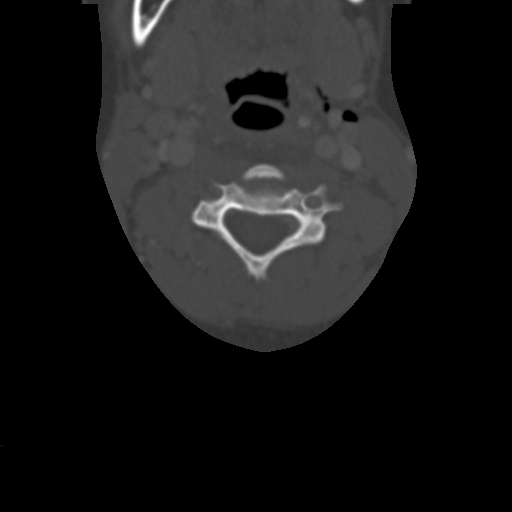
[im 91/114  bone]
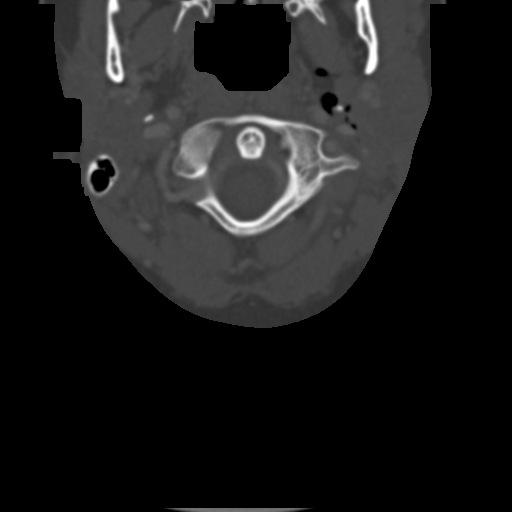

[Series 6: coronals · coronal · 0.48mm/px · 3 of 68 slices shown]
[im 14/68  bone]
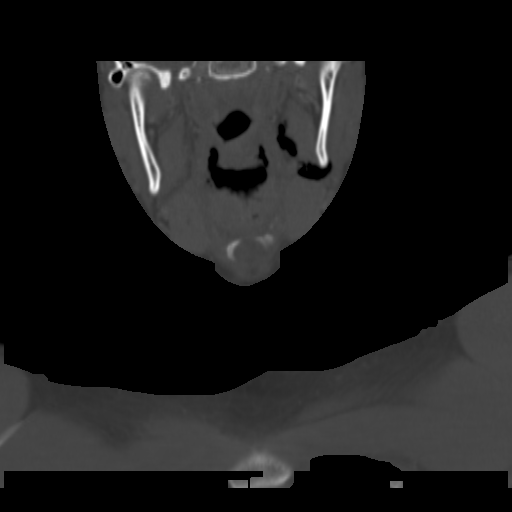
[im 27/68  bone]
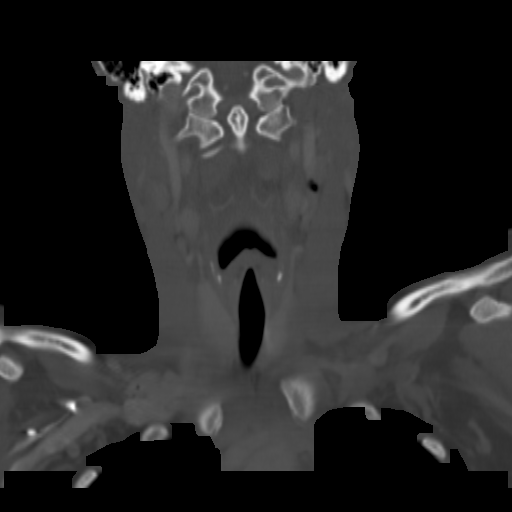
[im 41/68  bone]
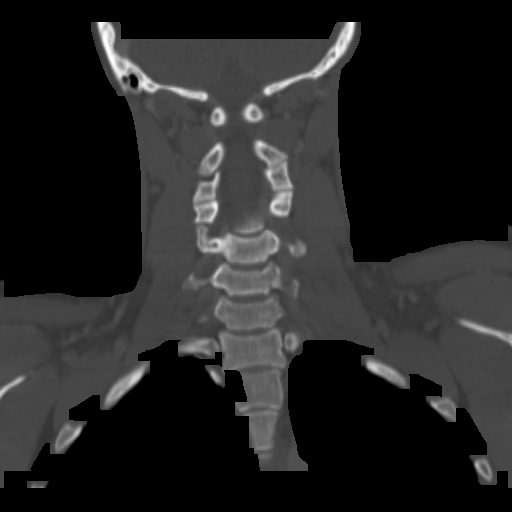

[Series 7: sagittals · sagittal · 0.48mm/px · 5 of 63 slices shown, 6 images]
[im 21/63  bone]
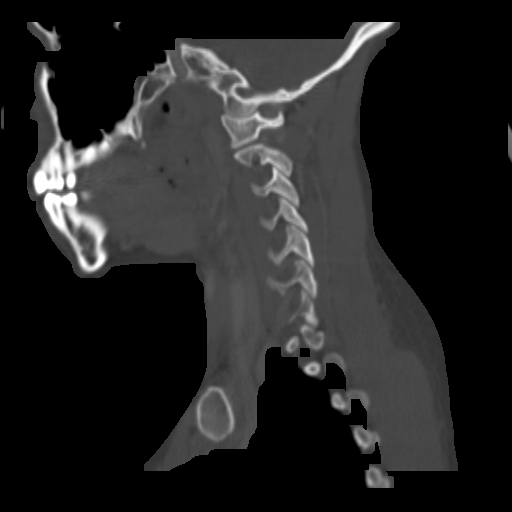
[im 26/63  bone]
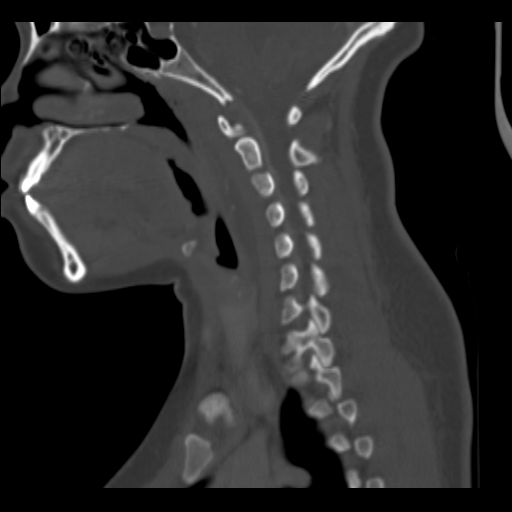
[im 32/63  soft-tissue]
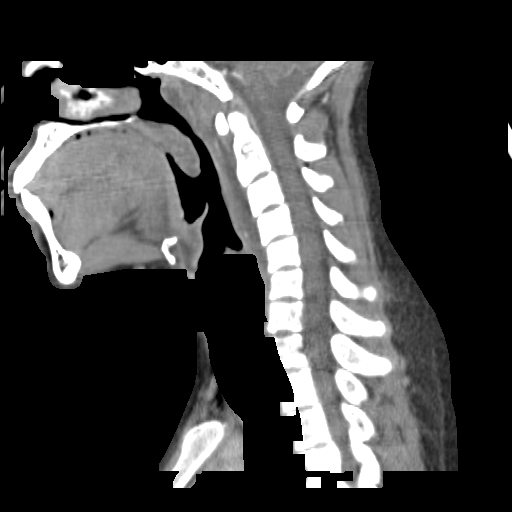
[im 32/63  bone]
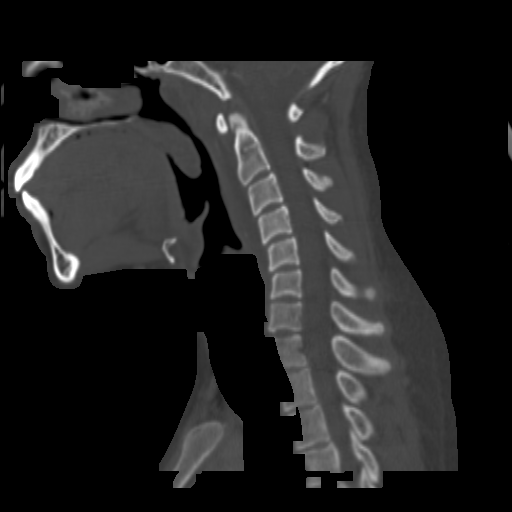
[im 37/63  bone]
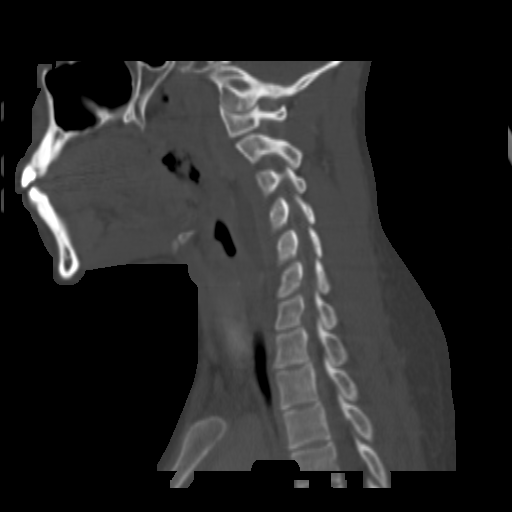
[im 42/63  bone]
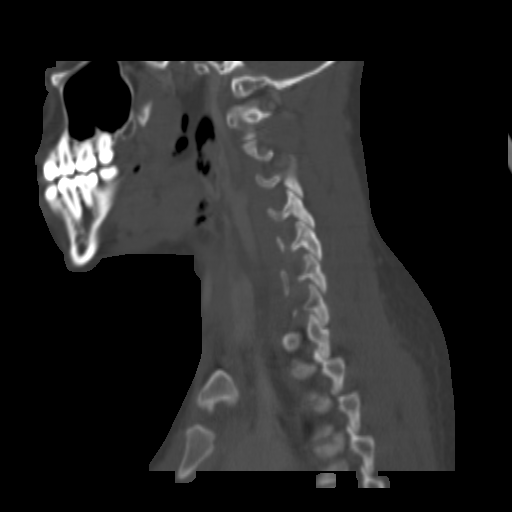

[Series 8: orthogonals · axial · 0.48mm/px · z∈[+985,+1117]mm · 4 of 111 slices shown]
[im 23/111  bone]
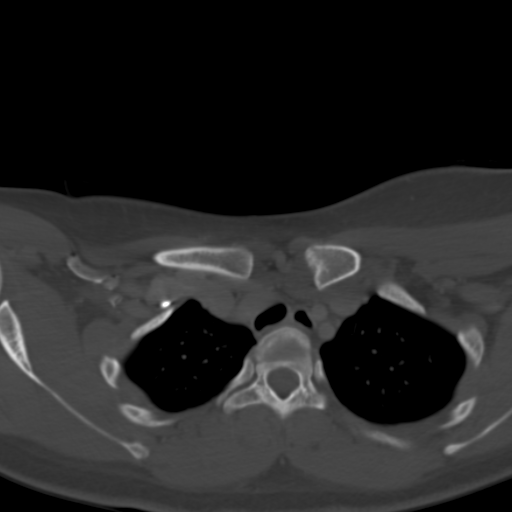
[im 45/111  bone]
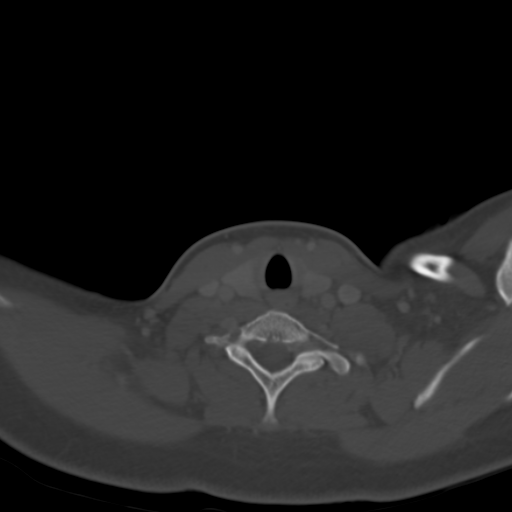
[im 67/111  bone]
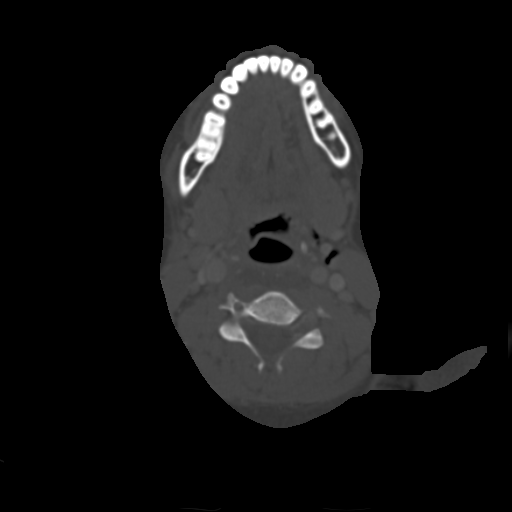
[im 89/111  bone]
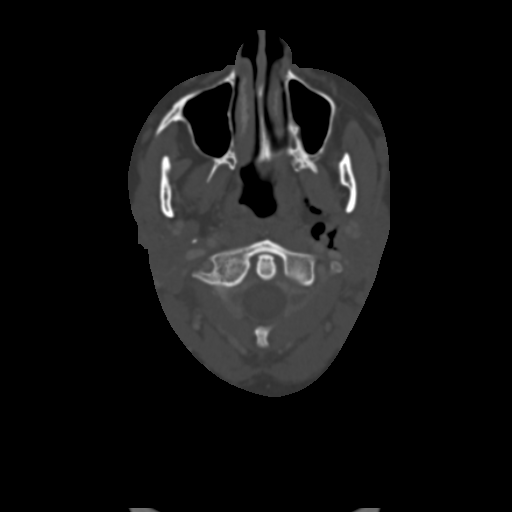

[16 of 33 positions shown; findings below may reference images not displayed]

FINDINGS: There is an elongated retropharyngeal   fluid collection
measuring 5 x 38 mm maximum transverse dimensions, extending over a
length of approximately 6.6 cm, from just below the anterior arch
of C1 to the C6 level.  There is little peripheral enhancement or
evidence of loculation.

Separately, there are gas bubbles lateral to the left tonsillar
fossa and extending into the masticator space on the left.  There
is no associated fluid or significant adjacent
inflammatory/edematous change.  No discrete mucosal defect is
identified.

There are a few shotty bilateral cervical nodes, none measuring
greater than 1 cm short axis diameter.

Visualized lung apices clear.  There is reversal of the normal
cervical lordosis without underlying bone anomaly.
IMPRESSION: 1.  Early retropharyngeal effusion without discrete loculation or
peripheral enhancement to indicate abscess.
2.  Abnormal gas tracking lateral to the left tonsillar fossa into
the masticator space suggesting a mucosal defect.

## 2013-04-08 IMAGING — CR DG CHEST 2V
2 series · 2 of 2 positions shown · non-contrast
Comparison: None.

CLINICAL DATA: Sore throat, vomiting.

CHEST - 2 VIEW

[w chest pa]
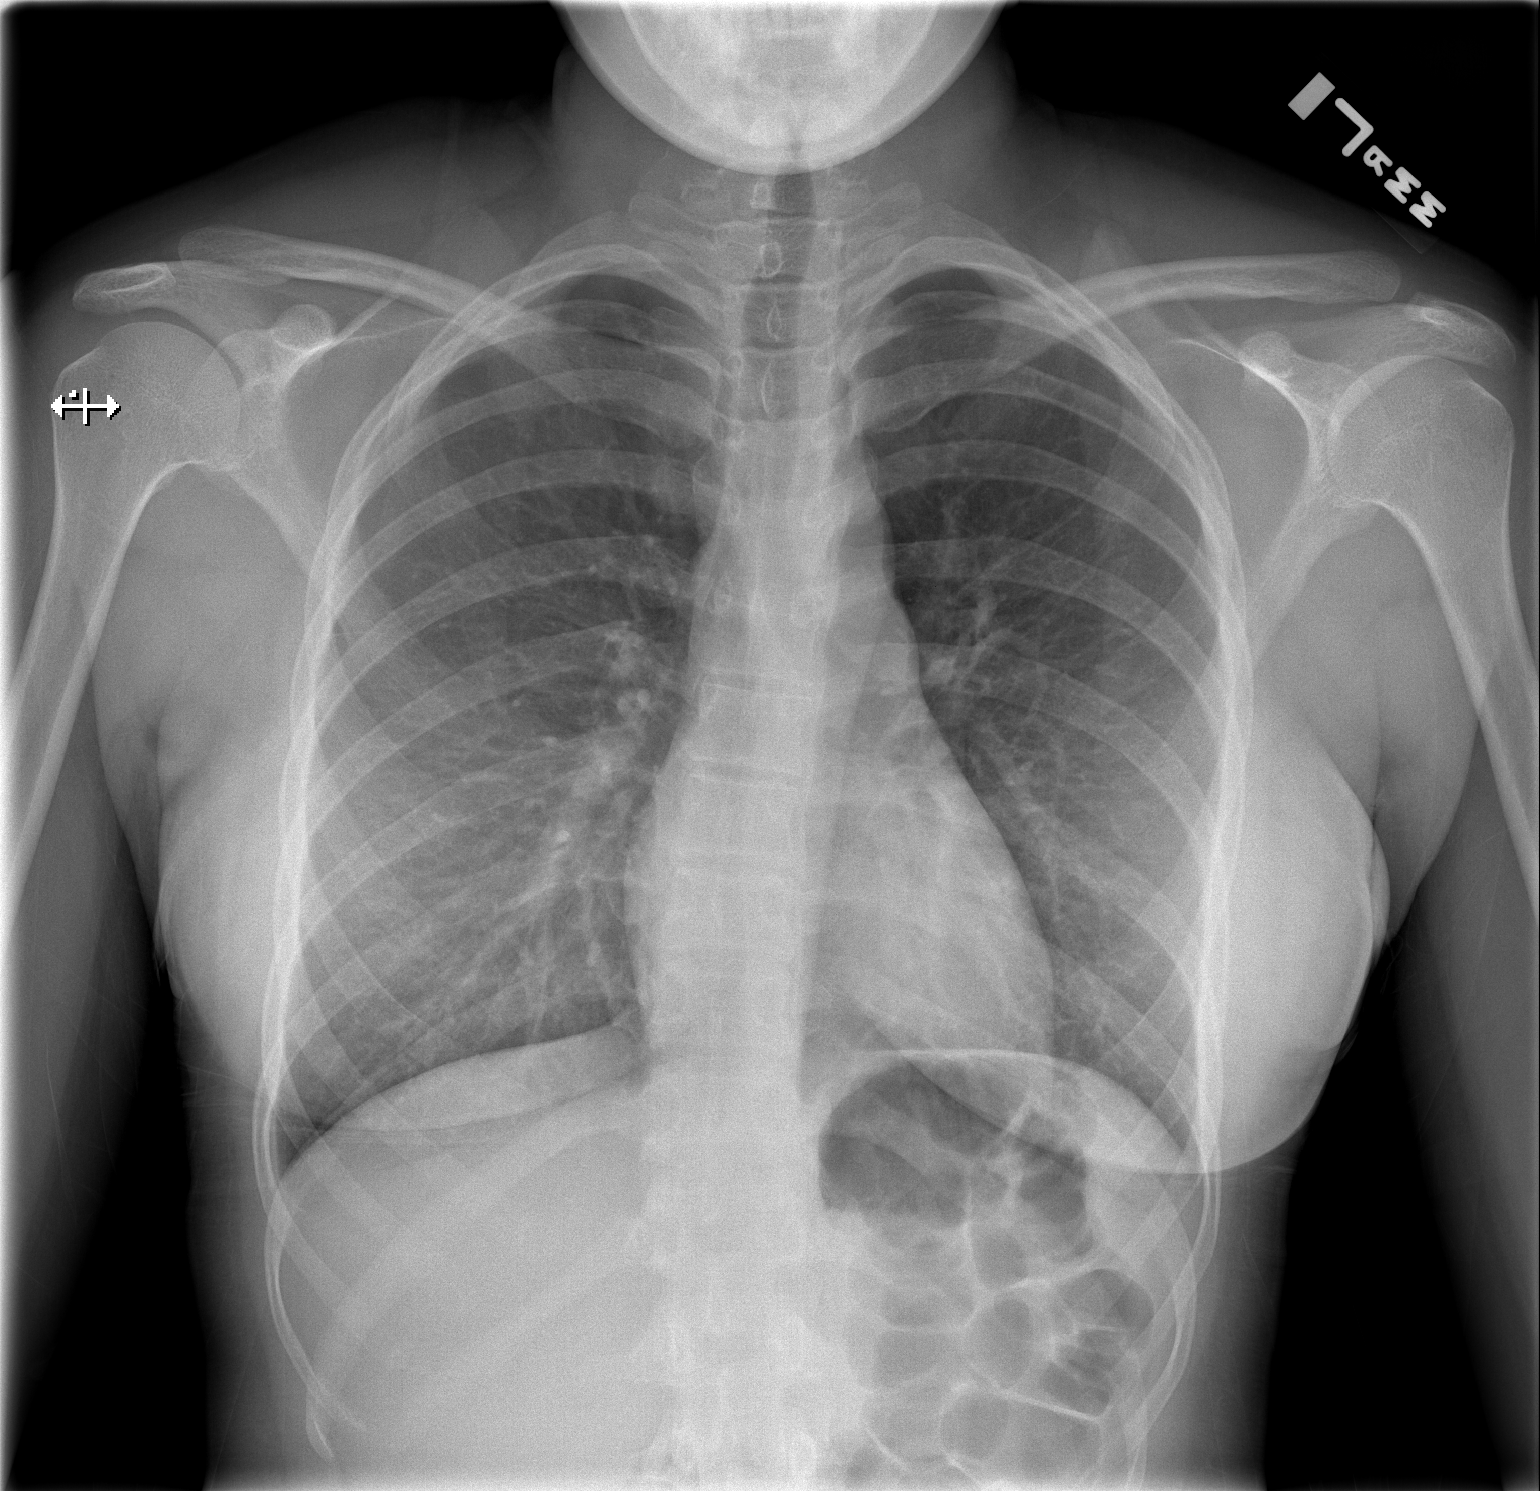

[w chest lat]
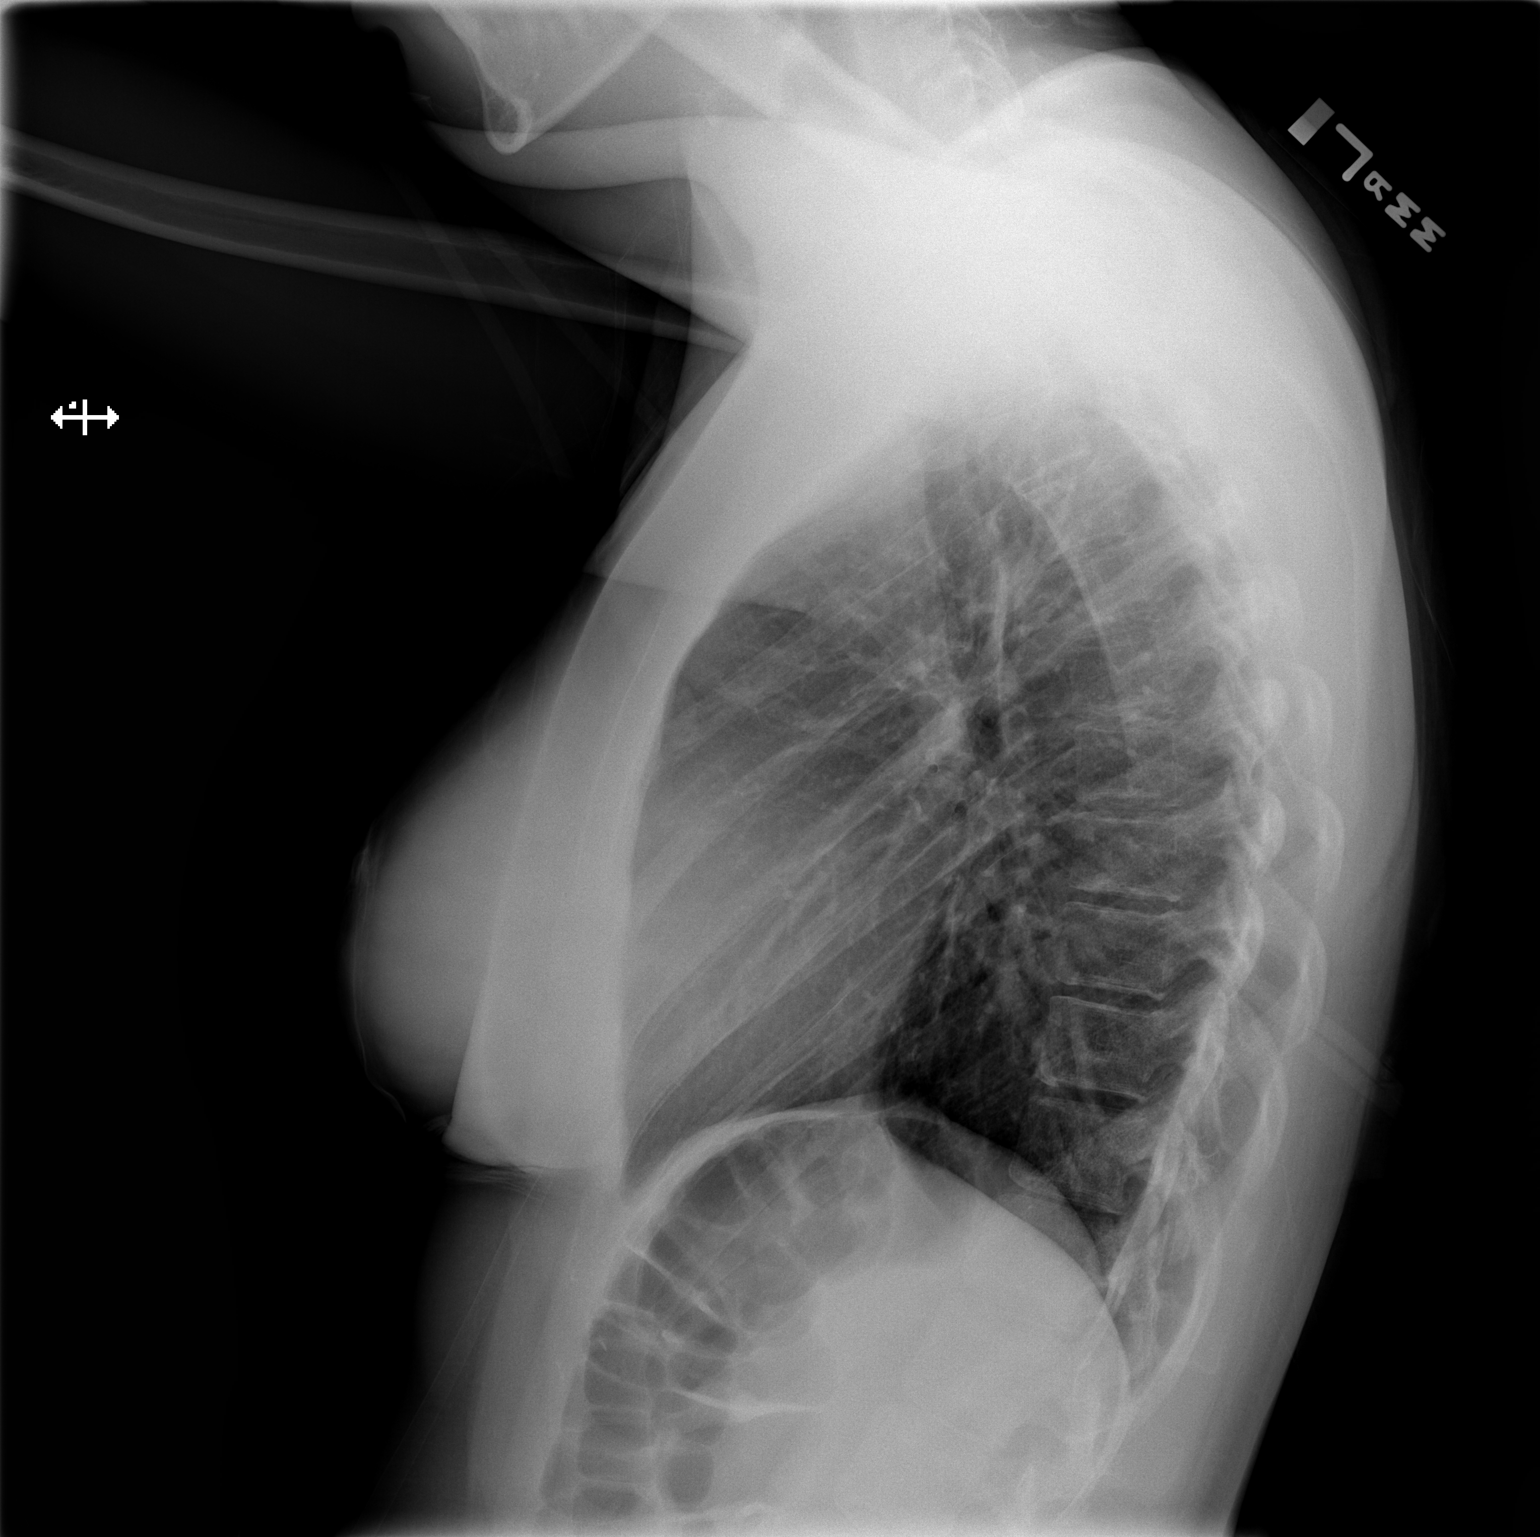

[2 of 2 positions shown; findings below may reference images not displayed]

FINDINGS: Lungs clear.  Heart size and pulmonary vascularity
normal.  No effusion.  Visualized bones unremarkable.
IMPRESSION: No acute disease

## 2013-04-20 ENCOUNTER — Ambulatory Visit (INDEPENDENT_AMBULATORY_CARE_PROVIDER_SITE_OTHER): Payer: BC Managed Care – PPO | Admitting: Family Medicine

## 2013-04-20 VITALS — BP 118/78 | HR 80 | Temp 99.1°F | Resp 18 | Ht 67.0 in | Wt 150.2 lb

## 2013-04-20 DIAGNOSIS — J029 Acute pharyngitis, unspecified: Secondary | ICD-10-CM

## 2013-04-20 DIAGNOSIS — R509 Fever, unspecified: Secondary | ICD-10-CM

## 2013-04-20 LAB — POCT RAPID STREP A (OFFICE): Rapid Strep A Screen: NEGATIVE

## 2013-04-20 LAB — POCT INFLUENZA A/B
INFLUENZA A, POC: NEGATIVE
Influenza B, POC: NEGATIVE

## 2013-04-20 MED ORDER — AMOXICILLIN 500 MG PO TABS: 1000.0000 mg | ORAL_TABLET | Freq: Two times a day (BID) | ORAL | Status: DC

## 2013-04-20 NOTE — Patient Instructions (Signed)
Sore Throat A sore throat is pain, burning, irritation, or scratchiness of the throat. There is often pain or tenderness when swallowing or talking. A sore throat may be accompanied by other symptoms, such as coughing, sneezing, fever, and swollen neck glands. A sore throat is often the first sign of another sickness, such as a cold, flu, strep throat, or mononucleosis (commonly known as mono). Most sore throats go away without medical treatment. CAUSES  The most common causes of a sore throat include:  A viral infection, such as a cold, flu, or mono.  A bacterial infection, such as strep throat, tonsillitis, or whooping cough.  Seasonal allergies.  Dryness in the air.  Irritants, such as smoke or pollution.  Gastroesophageal reflux disease (GERD). HOME CARE INSTRUCTIONS   Only take over-the-counter medicines as directed by your caregiver.  Drink enough fluids to keep your urine clear or pale yellow.  Rest as needed.  Try using throat sprays, lozenges, or sucking on hard candy to ease any pain (if older than 4 years or as directed).  Sip warm liquids, such as broth, herbal tea, or warm water with honey to relieve pain temporarily. You may also eat or drink cold or frozen liquids such as frozen ice pops.  Gargle with salt water (mix 1 tsp salt with 8 oz of water).  Do not smoke and avoid secondhand smoke.  Put a cool-mist humidifier in your bedroom at night to moisten the air. You can also turn on a hot shower and sit in the bathroom with the door closed for 5 10 minutes. SEEK IMMEDIATE MEDICAL CARE IF:  You have difficulty breathing.  You are unable to swallow fluids, soft foods, or your saliva.  You have increased swelling in the throat.  Your sore throat does not get better in 7 days.  You have nausea and vomiting.  You have a fever or persistent symptoms for more than 2 3 days.  You have a fever and your symptoms suddenly get worse. MAKE SURE YOU:   Understand  these instructions.  Will watch your condition.  Will get help right away if you are not doing well or get worse. Document Released: 02/21/2004 Document Revised: 12/31/2011 Document Reviewed: 09/21/2011 Exit[employer] Patient Information 2014 [employer].  

## 2013-04-20 NOTE — Progress Notes (Signed)
Subjective:    Patient ID: Sharon Shaw, female    DOB: 1990-11-09, 23 y.o.   MRN: [id number]  04/20/2013  Sore Throat; Fever; and Headache   HPI This 23 y.o. female presents for evaluation of sore throat, fever, headache.  Onset three days ago.  +fever Tmax unsure; +aches; +chills; +sweats.  +HA; +ear pain L; no rhinorrhea; no nasal congestion; +mild cough; +SOB with exertion mild.  +nausea; no vomiting; no diarrhea.  Works at [employer].  +sick contacts.  [place].  S/p tonsillectomy.  L side of throat hurting worse than R.  S/p flu vaccine.   Review of Systems  Constitutional: Positive for fever and chills.  HENT: Positive for ear pain, sore throat and voice change. Negative for congestion and rhinorrhea.   Respiratory: Positive for cough and shortness of breath.   Gastrointestinal: Positive for nausea. Negative for vomiting and diarrhea.  Skin: Negative for rash.  Neurological: Positive for headaches.    Past Medical History  Diagnosis Date  . PVCs (premature ventricular contractions)   . Eczema   . Chronic tonsillitis 05/2011    mother denies sx. of sleep apnea  . GERD (gastroesophageal reflux disease)    Past Surgical History  Procedure Laterality Date  . Wisdom tooth extraction    . Tonsillectomy and adenoidectomy  06/02/2011    Procedure: TONSILLECTOMY AND ADENOIDECTOMY;  Surgeon: Flo Shanks, MD;  Location:  SUR[facility]ERY CENTER;  Service: ENT;  Laterality: N/A;  tonsilectomy and adenoid abliteration    No Known Allergies Current Outpatient Prescriptions  Medication Sig Dispense Refill  . amoxicillin (AMOXIL) 500 MG tablet Take 2 tablets (1,000 mg total) by mouth 2 (two) times daily. 40 tablet 0  . HYDROcodone-acetaminophen (NORCO/VICODIN) 5-325 MG per tablet Take 1 tablet by mouth every 6 (six) hours as needed for pain. 15 tablet 0  . ibuprofen (ADVIL,MOTRIN) 800 MG tablet Take 1 tablet (800 mg total) by mouth every 8 (eight) hours as needed for pain. 21  tablet 0   No current facility-administered medications for this visit.   History   Social History  . Marital Status: Single    Spouse Name: N/A    Number of Children: N/A  . Years of Education: N/A   Occupational History  . Not on file.   Social History Main Topics  . Smoking status: Never Smoker   . Smokeless tobacco: Never Used  . Alcohol Use: 0.0 oz/week     Comment: occasionally  . Drug Use: No  . Sexual Activity: Yes    Birth Control/ Protection: None     Comment: discussed need for contraception 11/11/11   Other Topics Concern  . Not on file   Social History Narrative   Pt lives in [place] with roommates.  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  Works at [employer].        Objective:    BP 118/78 mmHg  Pulse 80  Temp(Src) 99.1 F (37.3 C) (Oral)  Resp 18  Ht 5\' 7"  (1.702 m)  Wt 150 lb 3.2 oz (68.13 kg)  BMI 23.52 kg/m2  SpO2 100%  LMP 04/20/2013 Physical Exam  Nursing note and vitals reviewed. Constitutional: She is oriented to person, place, and time. She appears well-developed and well-nourished. No distress.  HENT:  Head: Normocephalic and atraumatic.  Right Ear: External ear normal.  Left Ear: External ear normal.  Nose: Nose normal.  Mouth/Throat: Mucous membranes are normal. Posterior oropharyngeal erythema present. No oropharyngeal exudate, posterior oropharyngeal edema or  tonsillar abscesses.  Eyes: Conjunctivae are normal. Pupils are equal, round, and reactive to light.  Neck: Normal range of motion. Neck supple.  Cardiovascular: Normal rate, regular rhythm and normal heart sounds.  Exam reveals no gallop and no friction rub.   No murmur heard. Pulmonary/Chest: Effort normal and breath sounds normal. She has no wheezes. She has no rales.  Lymphadenopathy:    She has cervical adenopathy.  Neurological: She is alert and oriented to person, place, and time.  Skin: No rash noted. She is not diaphoretic.  Psychiatric: She has a normal mood  and affect. Her behavior is normal.   Results for orders placed or performed in visit on 04/20/13  Culture, Group A Strep  Result Value Ref Range   Organism ID, Bacteria Normal Upper Respiratory Flora    Organism ID, Bacteria No Beta Hemolytic Streptococci Isolated   POCT Influenza A/B  Result Value Ref Range   Influenza A, POC Negative    Influenza B, POC Negative   POCT rapid strep A  Result Value Ref Range   Rapid Strep A Screen Negative Negative       Assessment & Plan:  Fever - Plan: POCT Influenza A/B, POCT rapid strep A  Sore throat - Plan: POCT Influenza A/B, POCT rapid strep A, Culture, Group A Strep   1.  Sore throat:  New.  Associated with fever.  Treat empirically while awaiting throat culture. RTC one week if symptoms persists to be evaluated for acute mono infection. RTC inability to swallow. Supportive care with rest, fluids, gargles, Ibuprofen or Tylenol.    Meds ordered this encounter  Medications  . amoxicillin (AMOXIL) 500 MG tablet    Sig: Take 2 tablets (1,000 mg total) by mouth 2 (two) times daily.    Dispense:  40 tablet    Refill:  0    No Follow-up on file.    Nilda SimmerKristi Aurelia Gras, M.D.  [facility]  [place] [address] [place], [place]  [id number] [phone] phone [phone] fax

## 2013-04-23 LAB — CULTURE, GROUP A STREP: ORGANISM ID, BACTERIA: NORMAL

## 2013-12-21 ENCOUNTER — Ambulatory Visit (INDEPENDENT_AMBULATORY_CARE_PROVIDER_SITE_OTHER): Payer: BC Managed Care – PPO | Admitting: Family Medicine

## 2013-12-21 ENCOUNTER — Encounter: Payer: Self-pay | Admitting: Physician Assistant

## 2013-12-21 VITALS — BP 118/64 | HR 67 | Temp 98.0°F | Resp 16 | Ht 66.5 in | Wt 155.2 lb

## 2013-12-21 DIAGNOSIS — R079 Chest pain, unspecified: Secondary | ICD-10-CM

## 2013-12-21 DIAGNOSIS — R002 Palpitations: Secondary | ICD-10-CM

## 2013-12-21 LAB — POCT CBC
Granulocyte percent: 45.3 %G (ref 37–80)
HEMATOCRIT: 39.1 % (ref 37.7–47.9)
Hemoglobin: 12.9 g/dL (ref 12.2–16.2)
LYMPH, POC: 2 (ref 0.6–3.4)
MCH: 32.3 pg — AB (ref 27–31.2)
MCHC: 33 g/dL (ref 31.8–35.4)
MCV: 97.8 fL — AB (ref 80–97)
MID (CBC): 1.1 — AB (ref 0–0.9)
MPV: 8.5 fL (ref 0–99.8)
PLATELET COUNT, POC: 305 10*3/uL (ref 142–424)
POC Granulocyte: 2.6 (ref 2–6.9)
POC LYMPH %: 34.9 % (ref 10–50)
POC MID %: 19.8 %M — AB (ref 0–12)
RBC: 4 M/uL — AB (ref 4.04–5.48)
RDW, POC: 13.3 %
WBC: 5.8 10*3/uL (ref 4.6–10.2)

## 2013-12-21 MED ORDER — METOPROLOL TARTRATE 25 MG PO TABS: 12.5000 mg | ORAL_TABLET | Freq: Two times a day (BID) | ORAL | Status: DC

## 2013-12-21 NOTE — Patient Instructions (Signed)
I think your symptoms are due to your PVCs giving you trouble once again. We've drawn labs to look at your electrolytes, your thyroid, and if you're anemic. We will let you know the results of these tests. Please start taking the metoprolol 12.5 mg twice daily.  We will refer you back to cardiology for follow up moving forward. We will be in contact with you to let you know the results.  Please go to the ED in the meantime if the chest pain gets worse, if the palpitations get worse, or if you start to feel worse.

## 2013-12-21 NOTE — Progress Notes (Signed)
Subjective:    Patient ID: Sharon Shaw, female    DOB: [birth year], 23 y.o.   MRN: [id number]  PCP: Default, Provider, MD  Chief Complaint  Patient presents with  . Chest Pain    x3 days- intermittent   . Shortness of Breath    some SOB assoc. with the chest pain   . Palpitations    x3 days- h/o PVCs but this feels different according to pt   Patient Active Problem List   Diagnosis Date Noted  . PVC's (premature ventricular contractions) 05/26/2011   Prior to Admission medications   Not on File   Medications, allergies, past medical history, surgical history, family history, social history and problem list reviewed and updated.  HPI  [age] yof with PMH known PVCs worked up by cardiology presents today with CP, SOB, and palps.   She has had uri sx for the past week with nonproductive cough and congestion. Fever one time 3 days ago. Two days ago while driving to work she experienced some sudden onset SSCP which was described as sharp then tight, rated 6/10. Occasionally radiated to left lower breast area. CP resolved on its own then returned several hrs later. Has been fairly constant since then. It did ease up for couple hrs last night but returned again today. The CP worsens slightly with activity, going from 6/10 to 8/10. She has also had more SOB than normal with activity and while lying on left side past couple days.   She has had these type of sx before. Was worked up by cardiology in 4/13 and found to have freq PVCs, otherwise had normal echo, normal stress test, normal holter besides pvcs. She was told to start nadolol 10mg  qd. She did this for a month but stopped as it made her feel nauseated.   She then had these similar sx one year ago and was seen in ED. Work up was neg, had echo per pt as outpt which was normal. CP was attriubted to PVCs. She feels this episode is diff due to assoc SOB. She denies any anxiety or panic attacks.   No recent immobilization, no fam hx  blood clots, no recent leg pain, non-smoker, no ocps. PERC neg.  No change caffeine intake, no stimulants, no decongestants, sleeping 5-6 hrs night lately which is abnormally low for her.   Review of Systems No chills. No N/V, diarrhea, dysuria.     Objective:   Physical Exam  Constitutional: She is oriented to person, place, and time. She appears well-developed and well-nourished.  Non-toxic appearance. She does not have a sickly appearance. She does not appear ill. No distress.  BP 118/64 mmHg  Pulse 67  Temp(Src) 98 F (36.7 C) (Oral)  Resp 16  Ht 5' 6.5" (1.689 m)  Wt 155 lb 3.2 oz (70.398 kg)  BMI 24.68 kg/m2  SpO2 100%  LMP 11/26/2013   Neck: No thyromegaly present.  Cardiovascular: Normal rate, S1 normal, S2 normal and normal heart sounds.  An irregular rhythm present. Exam reveals no gallop.   No murmur heard. Pulses:      Dorsalis pedis pulses are 2+ on the right side, and 2+ on the left side.       Posterior tibial pulses are 2+ on the right side, and 2+ on the left side.  Pulmonary/Chest: Effort normal and breath sounds normal. No tachypnea. No respiratory distress. She has no decreased breath sounds. She has no wheezes. She has no rhonchi. She has no rales.  Neurological: She is alert and oriented to person, place, and time.  Psychiatric: She has a normal mood and affect. Her speech is normal.    Results for orders placed or performed in visit on 12/21/13  POCT CBC  Result Value Ref Range   WBC 5.8 4.6 - 10.2 K/uL   Lymph, poc 2.0 0.6 - 3.4   POC LYMPH PERCENT 34.9 10 - 50 %L   MID (cbc) 1.1 (A) 0 - 0.9   POC MID % 19.8 (A) 0 - 12 %M   POC Granulocyte 2.6 2 - 6.9   Granulocyte percent 45.3 37 - 80 %G   RBC 4.00 (A) 4.04 - 5.48 M/uL   Hemoglobin 12.9 12.2 - 16.2 g/dL   HCT, POC 16.139.1 09.637.7 - 47.9 %   MCV 97.8 (A) 80 - 97 fL   MCH, POC 32.3 (A) 27 - 31.2 pg   MCHC 33.0 31.8 - 35.4 g/dL   RDW, POC 04.513.3 %   Platelet Count, POC 305 142 - 424 K/uL   MPV 8.5 0 -  99.8 fL      Assessment & Plan:   [age] yof with PMH known PVCs worked up by cardiology presents today with CP, SOB, and palps.   Chest pain, unspecified chest pain type - Plan: EKG 12-Lead, POCT CBC, Basic metabolic panel Palpitations - Plan: EKG 12-Lead, TSH, Magnesium, metoprolol tartrate (LOPRESSOR) 25 MG tablet --presenting sx today similar to 2 previous episodes over past 2 years --concern for PE, MI low. Normal cardiac and pulm exam other than PVCs --PVCs, vent trigeminy on ekg today. Likely cause of pts sx. --spoke with cardiologist on call - start metoprolol tartrate bid, referral to cardiology for further w/u --labs today - no anemia, will f/u on rest of results  Sharon Shaw M. Sharon Kaylor, PA-C Physician Assistant-Certified [facility]  12/21/2013 4:18 PM

## 2013-12-22 LAB — BASIC METABOLIC PANEL
BUN: 9 mg/dL (ref 6–23)
CALCIUM: 9.8 mg/dL (ref 8.4–10.5)
CO2: 25 meq/L (ref 19–32)
Chloride: 103 mEq/L (ref 96–112)
Creat: 0.78 mg/dL (ref 0.50–1.10)
GLUCOSE: 76 mg/dL (ref 70–99)
Potassium: 3.9 mEq/L (ref 3.5–5.3)
Sodium: 139 mEq/L (ref 135–145)

## 2013-12-22 LAB — TSH: TSH: 0.643 u[IU]/mL (ref 0.350–4.500)

## 2013-12-22 LAB — MAGNESIUM: MAGNESIUM: 1.9 mg/dL (ref 1.5–2.5)

## 2013-12-22 NOTE — Progress Notes (Signed)
EKG read and patient discussed and examined with Mr. Sharon Shaw. Agree with assessment and plan of care per his note.

## 2014-02-20 ENCOUNTER — Ambulatory Visit: Payer: BC Managed Care – PPO | Admitting: Internal Medicine

## 2014-03-23 ENCOUNTER — Ambulatory Visit (HOSPITAL_COMMUNITY): Payer: BLUE CROSS/BLUE SHIELD | Attending: Cardiology | Admitting: Cardiology

## 2014-03-23 ENCOUNTER — Ambulatory Visit (INDEPENDENT_AMBULATORY_CARE_PROVIDER_SITE_OTHER): Payer: BLUE CROSS/BLUE SHIELD | Admitting: Internal Medicine

## 2014-03-23 ENCOUNTER — Encounter: Payer: Self-pay | Admitting: Internal Medicine

## 2014-03-23 VITALS — BP 110/62 | HR 75 | Ht 66.5 in | Wt 160.2 lb

## 2014-03-23 DIAGNOSIS — I493 Ventricular premature depolarization: Secondary | ICD-10-CM

## 2014-03-23 DIAGNOSIS — M79605 Pain in left leg: Secondary | ICD-10-CM

## 2014-03-23 DIAGNOSIS — M79606 Pain in leg, unspecified: Secondary | ICD-10-CM | POA: Insufficient documentation

## 2014-03-23 NOTE — Progress Notes (Signed)
Left lower venous duplex performed  

## 2014-03-23 NOTE — Patient Instructions (Signed)
Your physician wants you to follow-up in: 12 months with Dr Jacquiline DoeAllred You will receive a reminder letter in the mail two months in advance. If you don't receive a letter, please call our office to schedule the follow-up appointment.  Your physician has requested that you have an echocardiogram. Echocardiography is a painless test that uses sound waves to create images of your heart. It provides your doctor with information about the size and shape of your heart and how well your heart's chambers and valves are working. This procedure takes approximately one hour. There are no restrictions for this procedure.  Your physician has requested that you have a lower or upper extremity venous duplex. This test is an ultrasound of the veins in the legs or arms. It looks at venous blood flow that carries blood from the heart to the legs or arms. Allow one hour for a Lower Venous exam. Allow thirty minutes for an Upper Venous exam. There are no restrictions or special instructions.

## 2014-03-23 NOTE — Progress Notes (Signed)
Electrophysiology Office Note   Date:  03/23/2014   ID:  Sharon Hatterlicia Erisman, DOB 02-15-1990, MRN [id number]  PCP:  Default, Provider, MD  Cardiologist:  Dr Mayford Knifeurner Primary Electrophysiologist: Hillis RangeJames Cystal Shannahan, MD    Chief Complaint  Patient presents with  . Follow-up    PVCs     History of Present Illness: Sharon Shaw is a 24 y.o. female who presents today for electrophysiology evaluation.   She has done well since I saw her.  PVCs seem mostly asymptomatic though at times she has chest pain when they are more forceful.  She was seen in Urgent care in November and placed on lopressor.  She reports fatigue previously with nadolol.  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 Her concern today is with pain behind her L knee.  She has mild numbness at times.  She denies recent trauma/ surgery/ prolonged immobilization. She denies FH of hypercoagulability.   Today, she denies symptoms of palpitations, chest pain, shortness of breath, orthopnea, PND, lower extremity edema, claudication, dizziness, presyncope, syncope, bleeding, or neurologic sequela. The patient is tolerating medications without difficulties and is otherwise without complaint today.    Past Medical History  Diagnosis Date  . PVCs (premature ventricular contractions)   . Eczema   . Chronic tonsillitis 05/2011    mother denies sx. of sleep apnea  . GERD (gastroesophageal reflux disease)     Tried prilosec 5 yrs ago, no relief   Past Surgical History  Procedure Laterality Date  . Wisdom tooth extraction    . Tonsillectomy and adenoidectomy  06/02/2011    Procedure: TONSILLECTOMY AND ADENOIDECTOMY;  Surgeon: Flo ShanksKarol Wolicki, MD;  Location: [facility];  Service: ENT;  Laterality: N/A;  tonsilectomy and adenoid abliteration     Current Outpatient Prescriptions  Medication Sig Dispense Refill  . Dapsone (ACZONE EX) Apply 1 application topically 2 (two) times daily.    Marland Kitchen. doxycycline (DORYX) 150 MG  EC tablet Take 150 mg by mouth daily.    Marland Kitchen. tretinoin (RETIN-A) 0.05 % cream Apply 1 application topically at bedtime. Apply to affected area  0   No current facility-administered medications for this visit.    Allergies:   Metoprolol tartrate   Social History:  The patient  reports that she has never smoked. She has never used smokeless tobacco. She reports that she drinks alcohol. She reports that she does not use illicit drugs.   Family History:  The patient's  family history includes Cancer in her maternal grandfather; Hypertension in her maternal grandmother; Sudden death in her cousin.    ROS:  Please see the history of present illness.   All other systems are reviewed and negative.    PHYSICAL EXAM: VS:  BP 110/62 mmHg  Pulse 75  Ht 5' 6.5" (1.689 m)  Wt 160 lb 3.2 oz (72.666 kg)  BMI 25.47 kg/m2 , BMI Body mass index is 25.47 kg/(m^2). GEN: Well nourished, well developed, in no acute distress HEENT: normal Neck: no JVD, carotid bruits, or masses Cardiac: RRR; no murmurs, rubs, or gallops,no edema  Respiratory:  clear to auscultation bilaterally, normal work of breathing GI: soft, nontender, nondistended, + BS MS: no deformity or atrophy Skin: warm and dry  Neuro:  Strength and sensation are intact Psych: euthymic mood, full affect Extr: L leg with 2+ popliteal, DP/PT pulses, no swelling, homans or cords, no appreciable bakers cyst  EKG:  EKG is ordered today. The ekg ordered today shows sinus rhythm with PVCs  Recent Labs: 12/21/2013: BUN 9; Creatinine 0.78; Hemoglobin 12.9; Magnesium 1.9; Potassium 3.9; Sodium 139; TSH 0.643    Lipid Panel  No results found for: CHOL, TRIG, HDL, CHOLHDL, VLDL, LDLCALC, LDLDIRECT   Wt Readings from Last 3 Encounters:  03/23/14 160 lb 3.2 oz (72.666 kg)  12/21/13 155 lb 3.2 oz (70.398 kg)  04/20/13 150 lb 3.2 oz (68.13 kg)      Other studies Reviewed: Additional studies/ records that were reviewed today include: prior echo  2013    ASSESSMENT AND PLAN:  1.  PVCs Stable Prn metoprolol Repeat echo to evaluate for structural changes She is clear that she does not wish to consider ablation/ medicine change at this time As long as her EF remains preserved, this is reasonable  2. L popliteal fossa leg pain- new Exam is benign Will obtain venous doppler of the L leg to evaluate for DVT, bakers cyst or other cause Warm heat/ NSAIDs recommended Follow-up with PCP if not resolved    Current medicines are reviewed at length with the patient today.   The patient does not have concerns regarding her medicines.  The following changes were made today:  none   Follow-up:  Return to see EP NP in 1 year   Signed, Hillis Range, MD  03/23/2014 2:38 PM     [facility] [address] [place] [address] [phone] (office) [phone] (fax)

## 2014-03-31 ENCOUNTER — Other Ambulatory Visit (HOSPITAL_COMMUNITY): Payer: BLUE CROSS/BLUE SHIELD

## 2014-04-06 ENCOUNTER — Ambulatory Visit (HOSPITAL_COMMUNITY): Payer: BLUE CROSS/BLUE SHIELD | Attending: Cardiology

## 2014-04-06 DIAGNOSIS — I493 Ventricular premature depolarization: Secondary | ICD-10-CM | POA: Diagnosis present

## 2014-04-06 NOTE — Progress Notes (Signed)
2D Echo completed. 04/06/2014 

## 2014-04-19 ENCOUNTER — Telehealth: Payer: Self-pay | Admitting: Internal Medicine

## 2014-04-19 NOTE — Telephone Encounter (Signed)
Patient given results of echo.  

## 2014-04-19 NOTE — Telephone Encounter (Signed)
New Msg          Pt calling to get results of echo.   States she is returning call. Please call back.

## 2014-09-07 ENCOUNTER — Other Ambulatory Visit: Payer: Self-pay | Admitting: Obstetrics and Gynecology

## 2014-09-08 LAB — CYTOLOGY - PAP

## 2015-02-12 ENCOUNTER — Ambulatory Visit (INDEPENDENT_AMBULATORY_CARE_PROVIDER_SITE_OTHER): Payer: BLUE CROSS/BLUE SHIELD | Admitting: Family Medicine

## 2015-02-12 ENCOUNTER — Emergency Department (HOSPITAL_COMMUNITY)
Admission: EM | Admit: 2015-02-12 | Discharge: 2015-02-13 | Disposition: A | Discharge status: Home / Self Care | Payer: BLUE CROSS/BLUE SHIELD | Attending: Emergency Medicine | Admitting: Emergency Medicine

## 2015-02-12 ENCOUNTER — Encounter (HOSPITAL_COMMUNITY): Payer: Self-pay | Admitting: Adult Health

## 2015-02-12 ENCOUNTER — Ambulatory Visit (INDEPENDENT_AMBULATORY_CARE_PROVIDER_SITE_OTHER): Payer: BLUE CROSS/BLUE SHIELD

## 2015-02-12 VITALS — BP 112/72 | HR 88 | Temp 98.5°F | Resp 18 | Ht 66.5 in | Wt 167.0 lb

## 2015-02-12 DIAGNOSIS — R079 Chest pain, unspecified: Secondary | ICD-10-CM | POA: Insufficient documentation

## 2015-02-12 DIAGNOSIS — R0789 Other chest pain: Secondary | ICD-10-CM | POA: Diagnosis not present

## 2015-02-12 DIAGNOSIS — R0602 Shortness of breath: Secondary | ICD-10-CM

## 2015-02-12 DIAGNOSIS — R000 Tachycardia, unspecified: Secondary | ICD-10-CM | POA: Diagnosis not present

## 2015-02-12 LAB — CBC
HCT: 34.8 % — ABNORMAL LOW (ref 36.0–46.0)
Hemoglobin: 11.2 g/dL — ABNORMAL LOW (ref 12.0–15.0)
MCH: 30.9 pg (ref 26.0–34.0)
MCHC: 32.2 g/dL (ref 30.0–36.0)
MCV: 95.9 fL (ref 78.0–100.0)
PLATELETS: 309 10*3/uL (ref 150–400)
RBC: 3.63 MIL/uL — ABNORMAL LOW (ref 3.87–5.11)
RDW: 13.6 % (ref 11.5–15.5)
WBC: 3.9 10*3/uL — AB (ref 4.0–10.5)

## 2015-02-12 LAB — BASIC METABOLIC PANEL
Anion gap: 9 (ref 5–15)
BUN: 9 mg/dL (ref 6–20)
CHLORIDE: 105 mmol/L (ref 101–111)
CO2: 26 mmol/L (ref 22–32)
CREATININE: 0.79 mg/dL (ref 0.44–1.00)
Calcium: 9.5 mg/dL (ref 8.9–10.3)
GFR calc Af Amer: >60 mL/min (ref >60)
GFR calc non Af Amer: >60 mL/min (ref >60)
Glucose, Bld: 88 mg/dL (ref 65–99)
Potassium: 3.7 mmol/L (ref 3.5–5.1)
SODIUM: 140 mmol/L (ref 135–145)

## 2015-02-12 LAB — I-STAT TROPONIN, ED: Troponin i, poc: 0 ng/mL (ref 0.00–0.08)

## 2015-02-12 NOTE — Progress Notes (Signed)
[facility] [address], [place] [address] [phone]  Date:  02/12/2015   Name:  Sharon Shaw   DOB:  [birth year]   MRN:  [id number]  PCP:  Default, Provider, MD    Chief Complaint: Chest Pain and Shortness of Breath   History of Present Illness:  Sharon Shaw is a 25 y.o. very pleasant female patient who presents with the following:  Generally healthy young lady here today with chest pain.  She was seen here just over a year ago with chest pain and palpitations.  She was found to have PVC and trigeminy.    Today she came back to work after lunch and noted that her heart rate seemed to be up.  However it lasted for about an hour and caused some SOB and chest pain so she came in to be seen.  She is feeling improved but not 100 % so she came in to be seen. She states that she is currently having 4/10 chest pain ( I reminded her that 10/10 would mean the worst possible pain and she still feels that her CP is 4/10) She saw cardiology also back in 2013, and was in the ER also.  She also saw Dr. Johney Frame for electrophysiology eval in February of 2016.  She had a negative echo- states that she has had several echos over her life   Patient Active Problem List   Diagnosis Date Noted  . Leg pain 03/23/2014  . PVC's (premature ventricular contractions) 05/26/2011    Past Medical History  Diagnosis Date  . PVCs (premature ventricular contractions)   . Eczema   . Chronic tonsillitis 05/2011    mother denies sx. of sleep apnea  . GERD (gastroesophageal reflux disease)     Tried prilosec 5 yrs ago, no relief    Past Surgical History  Procedure Laterality Date  . Wisdom tooth extraction    . Tonsillectomy and adenoidectomy  06/02/2011    Procedure: TONSILLECTOMY AND ADENOIDECTOMY;  Surgeon: Flo Shanks, MD;  Location: [facility];  Service: ENT;  Laterality: N/A;  tonsilectomy and adenoid abliteration    Social History  Substance Use  Topics  . Smoking status: Never Smoker   . Smokeless tobacco: Never Used  . Alcohol Use: 0.0 oz/week    0 Standard drinks or equivalent per week     Comment: occasionally    Family History  Problem Relation Age of Onset  . Sudden death Cousin     maternal cousin had mental retardation, cardiomyopathy and sudden death  . Hypertension Maternal Grandmother   . Cancer Maternal Grandfather     Allergies  Allergen Reactions  . Metoprolol Tartrate     Cause pt to have CP    Medication list has been reviewed and updated.  Current Outpatient Prescriptions on File Prior to Visit  Medication Sig Dispense Refill  . Dapsone (ACZONE EX) Apply 1 application topically 2 (two) times daily. Reported on 02/12/2015    . doxycycline (DORYX) 150 MG EC tablet Take 150 mg by mouth daily. Reported on 02/12/2015    . tretinoin (RETIN-A) 0.05 % cream Apply 1 application topically at bedtime. Reported on 02/12/2015  0   No current facility-administered medications on file prior to visit.    Review of Systems:  As per HPI- otherwise negative.   Physical Examination: Filed Vitals:   02/12/15 1643  BP: 112/72  Pulse: 88  Temp: 98.5 F (36.9 C)  Resp: 18  Filed Vitals:   02/12/15 1643  Height: 5' 6.5" (1.689 m)  Weight: 167 lb (75.751 kg)   Body mass index is 26.55 kg/(m^2). Ideal Body Weight: Weight in (lb) to have BMI = 25: 156.9  GEN: WDWN, NAD, Non-toxic, A & O x 3, looks well, does not appear to be in pain HEENT: Atraumatic, Normocephalic. Neck supple. No masses, No LAD. Ears and Nose: No external deformity. CV: RRR, No M/G/R. No JVD. No thrill. No extra heart sounds.  Cannot reproduce her chest pain by pressing on her chest wall PULM: CTA B, no wheezes, crackles, rhonchi. No retractions. No resp. distress. No accessory muscle use. EXTR: No c/c/e NEURO Normal gait.  PSYCH: Normally interactive. Conversant. Not depressed or anxious appearing.  Calm demeanor.   [facility] reading (PRIMARY)  by  Dr. Patsy Lageropland. CXR: negative   EKG: SR with PVC every 5 beats or so. Comparable to previous chest EKG  Assessment and Plan: Other chest pain  SOB (shortness of breath) - Plan: DG Chest 2 View  Tachycardia - Plan: EKG 12-Lead  Young lady here today with complaint of palpitations, CP and SOB.  Explained that I think her sx are likely benign.  However I cannot explain her persistent CP which is still present.  Due to active chest pain I advised her that she needs to be seen at the ER.  She declines EMS transport but will drive herself there now   Signed Abbe AmsterdamJessica Copland, MD

## 2015-02-12 NOTE — ED Notes (Signed)
pesents with sternal chest pain described as constant and throbbing that began after coming back from eating pizza at 2:30 this afternoon associated with SOB. Nothing makes pain better and nothing makes pain worse. Pain is rated 5;/10. Denies nausea, dizziness and radiation.

## 2015-02-13 ENCOUNTER — Telehealth: Payer: Self-pay | Admitting: Internal Medicine

## 2015-02-13 MED ORDER — DILTIAZEM HCL ER COATED BEADS 120 MG PO CP24: 120.0000 mg | ORAL_CAPSULE | Freq: Every day | ORAL | Status: DC

## 2015-02-13 NOTE — Telephone Encounter (Signed)
Pt c/o of Chest Pain: STAT if CP now or developed within 24 hours  1. Are you having CP right now? yes  2. Are you experiencing any other symptoms (ex. SOB, nausea, vomiting, sweating)? SOB  3. How long have you been experiencing CP? Yesterday 1/16  4. Is your CP continuous or coming and going? constant  5. Have you taken Nitroglycerin? no ?

## 2015-02-13 NOTE — Telephone Encounter (Signed)
Pt called because she has been having constant chest pressure and PVC's since yesterday and some SOB. Pt went to urgent care the general practitioner send her to ER. In the ER. After  pt waited for 4 1/2 hours she was tolled by the nurse that she will have to wait for sometime so she left the ER. Pt  does not have any med's for PVC's she said that Metoprolol was order and it was d/C BECAUSE OF  An allergic reaction. Pt would like to know if she can be seen today.  Dr. Ladona Ridgel DOD aware of pt's symptoms, and recommends for pt to try Cardizem 120 mg once daily.  Pt to F/U with Dr. Johney Frame to consider Flecainide  Pt is aware of MD's recommendations. A prescription for Cardizem send to [facility] at [facility]. Pt is aware. Glynda Jaeger EP scheduler will call pt for F/U appointment.

## 2015-02-16 ENCOUNTER — Ambulatory Visit (INDEPENDENT_AMBULATORY_CARE_PROVIDER_SITE_OTHER): Payer: BLUE CROSS/BLUE SHIELD | Admitting: Internal Medicine

## 2015-02-16 ENCOUNTER — Encounter: Payer: Self-pay | Admitting: Internal Medicine

## 2015-02-16 VITALS — BP 112/72 | HR 81 | Ht 66.5 in | Wt 167.2 lb

## 2015-02-16 DIAGNOSIS — I493 Ventricular premature depolarization: Secondary | ICD-10-CM

## 2015-02-16 DIAGNOSIS — R079 Chest pain, unspecified: Secondary | ICD-10-CM

## 2015-02-16 MED ORDER — FLECAINIDE ACETATE 50 MG PO TABS: 50.0000 mg | ORAL_TABLET | Freq: Two times a day (BID) | ORAL | Status: DC

## 2015-02-16 NOTE — Patient Instructions (Signed)
Medication Instructions:  Your physician has recommended you make the following change in your medication:  1) START Flecainide 50 mg twice a day    (begin this medication 7-10 days prior to stress test)  Labwork: None ordered  Testing/Procedures: Your physician has requested that you have an exercise tolerance test. For further information please visit [email or web address] Please also follow instruction sheet, as given.  Your physician has recommended that you wear a 48 hour holter monitor. Holter monitors are medical devices that record the heart's electrical activity. Doctors most often use these monitors to diagnose arrhythmias. Arrhythmias are problems with the speed or rhythm of the heartbeat. The monitor is a small, portable device. You can wear one while you do your normal daily activities. This is usually used to diagnose what is causing palpitations/syncope (passing out).  Follow-Up: Your physician recommends that you schedule a follow-up appointment in: 6 weeks with Dr. Johney Frame.    If you need a refill on your cardiac medications before your next appointment, please call your pharmacy.  Thank you for choosing [facility]!!

## 2015-02-18 NOTE — Progress Notes (Signed)
Electrophysiology Office Note   Date:  02/18/2015   ID:  Sharon Shaw, DOB 31-Jul-1990, MRN [id number]  PCP:  Default, Provider, MD  Cardiologist:  Dr Mayford Knife Primary Electrophysiologist: Hillis Range, MD    Chief Complaint  Patient presents with  . PVCs     History of Present Illness: Sharon Shaw is a 25 y.o. female who presents today for electrophysiology evaluation.   She has mostly done well since I saw her.  PVCs seem mostly asymptomatic though at times she has chest pain when they are more forceful.  These occur in waves intermittently.  Over the past few weeks, they have been worse than usual.  She presented to the ER and had an uneventful workup except for her PVCs.    She is still working as a Clinical biochemist at [employer].  Today, she denies symptoms of shortness of breath, orthopnea, PND, lower extremity edema, claudication, dizziness, presyncope, syncope, bleeding, or neurologic sequela. The patient is tolerating medications without difficulties and is otherwise without complaint today.    Past Medical History  Diagnosis Date  . PVCs (premature ventricular contractions)   . Eczema   . Chronic tonsillitis 05/2011    mother denies sx. of sleep apnea  . GERD (gastroesophageal reflux disease)     Tried prilosec 5 yrs ago, no relief   Past Surgical History  Procedure Laterality Date  . Wisdom tooth extraction    . Tonsillectomy and adenoidectomy  06/02/2011    Procedure: TONSILLECTOMY AND ADENOIDECTOMY;  Surgeon: Flo Shanks, MD;  Location: [facility];  Service: ENT;  Laterality: N/A;  tonsilectomy and adenoid abliteration     Current Outpatient Prescriptions  Medication Sig Dispense Refill  . Dapsone (ACZONE EX) Apply 1 application topically 2 (two) times daily. Reported on 02/12/2015    . diltiazem (CARDIZEM CD) 120 MG 24 hr capsule Take 1 capsule (120 mg total) by mouth daily. 30 capsule 6  . tretinoin (RETIN-A) 0.05 % cream Apply 1  application topically at bedtime. Reported on 02/12/2015  0  . flecainide (TAMBOCOR) 50 MG tablet Take 1 tablet (50 mg total) by mouth 2 (two) times daily. 60 tablet 3   No current facility-administered medications for this visit.    Allergies:   Metoprolol tartrate   Social History:  The patient  reports that she has never smoked. She has never used smokeless tobacco. She reports that she drinks alcohol. She reports that she does not use illicit drugs.   Family History:  The patient's  family history includes Cancer in her maternal grandfather; Hypertension in her maternal grandmother; Sudden death in her cousin.    ROS:  Please see the history of present illness.   All other systems are reviewed and negative.    PHYSICAL EXAM: VS:  BP 112/72 mmHg  Pulse 81  Ht 5' 6.5" (1.689 m)  Wt 167 lb 3.2 oz (75.841 kg)  BMI 26.59 kg/m2  LMP 01/31/2015 , BMI Body mass index is 26.59 kg/(m^2). GEN: Well nourished, well developed, in no acute distress HEENT: normal Neck: no JVD, carotid bruits, or masses Cardiac: RRR with ectopy; no murmurs, rubs, or gallops,no edema  Respiratory:  clear to auscultation bilaterally, normal work of breathing GI: soft, nontender, nondistended, + BS MS: no deformity or atrophy Skin: warm and dry  Neuro:  Strength and sensation are intact Psych: euthymic mood, full affect  EKG:  EKG is ordered today. The ekg ordered today shows sinus rhythm with PVCs (LBBB inferior axis  with precordial transition at V3)   Recent Labs: 02/12/2015: BUN 9; Creatinine, Ser 0.79; Hemoglobin 11.2*; Platelets 309; Potassium 3.7; Sodium 140    Lipid Panel  No results found for: CHOL, TRIG, HDL, CHOLHDL, VLDL, LDLCALC, LDLDIRECT   Wt Readings from Last 3 Encounters:  02/16/15 167 lb 3.2 oz (75.841 kg)  02/12/15 167 lb (75.751 kg)  02/12/15 167 lb (75.751 kg)      Other studies Reviewed: Additional studies/ records that were reviewed today include: prior echo 2016      ASSESSMENT AND PLAN:  1.  PVCs She has been started on diltiazem by Dr Ladona Ridgel by phone but without improvement. At this time, will add flecainide  BID.  We can consider increasing flecainide if needed or taking only prn once improved. ETT once on flecainide to evaluate for exercise induced arrhythmia and further evaluate chest pain We discussed repeat echo at this time, however she states that echo last year cost her $1200 and she would like to avoid repeat echo at this time.  I think that this is reasonable 48 hour holter to further evaluate PVC burden   Today, I have spent 25 minutes with the patient discussing PVCs .  More than 50% of the visit time today was spent on this issue.    Current medicines are reviewed at length with the patient today.   The patient does not have concerns regarding her medicines.  The following changes were made today:  none   Follow-up:  Return to see me in 6 weeks for further assessment   Signed, Hillis Range, MD  02/18/2015 4:02 PM     [facility] [address] [place] [address] [phone] (office) [phone] (fax)

## 2015-02-20 ENCOUNTER — Ambulatory Visit (INDEPENDENT_AMBULATORY_CARE_PROVIDER_SITE_OTHER): Payer: BLUE CROSS/BLUE SHIELD

## 2015-02-20 DIAGNOSIS — R079 Chest pain, unspecified: Secondary | ICD-10-CM

## 2015-02-20 DIAGNOSIS — I493 Ventricular premature depolarization: Secondary | ICD-10-CM | POA: Diagnosis not present

## 2015-02-21 NOTE — Addendum Note (Signed)
Addended by: Reesa Chew on: 02/21/2015 05:40 PM   Modules accepted: Orders

## 2015-02-28 ENCOUNTER — Ambulatory Visit (INDEPENDENT_AMBULATORY_CARE_PROVIDER_SITE_OTHER): Payer: BLUE CROSS/BLUE SHIELD

## 2015-02-28 ENCOUNTER — Encounter: Payer: BLUE CROSS/BLUE SHIELD | Admitting: Physician Assistant

## 2015-02-28 DIAGNOSIS — I493 Ventricular premature depolarization: Secondary | ICD-10-CM

## 2015-02-28 DIAGNOSIS — R079 Chest pain, unspecified: Secondary | ICD-10-CM

## 2015-02-28 LAB — EXERCISE TOLERANCE TEST
CHL CUP MPHR: 196 {beats}/min
CHL RATE OF PERCEIVED EXERTION: 15
CSEPEW: 10.1 METS
CSEPHR: 93 %
CSEPPHR: 184 {beats}/min
Exercise duration (min): 9 min
Rest HR: 86 {beats}/min

## 2015-03-09 ENCOUNTER — Encounter: Payer: Self-pay | Admitting: Internal Medicine

## 2015-03-27 ENCOUNTER — Ambulatory Visit (INDEPENDENT_AMBULATORY_CARE_PROVIDER_SITE_OTHER): Payer: BLUE CROSS/BLUE SHIELD | Admitting: Internal Medicine

## 2015-03-27 ENCOUNTER — Encounter: Payer: Self-pay | Admitting: Internal Medicine

## 2015-03-27 VITALS — BP 120/80 | HR 78 | Ht 66.5 in | Wt 167.0 lb

## 2015-03-27 DIAGNOSIS — I493 Ventricular premature depolarization: Secondary | ICD-10-CM | POA: Diagnosis not present

## 2015-03-27 NOTE — Progress Notes (Signed)
Electrophysiology Office Note   Date:  03/27/2015   ID:  Sharon Shaw, DOB March 26, 1990, MRN [id number]   Cardiologist:  Dr Mayford Knife Primary Electrophysiologist: Hillis Range, MD    Chief Complaint  Patient presents with  . PVCs     History of Present Illness: Sharon Shaw is a 25 y.o. female who presents today for electrophysiology evaluation.  She is doing well.  Unaware of ongoing PVCs.  Feels "much Shaw" with flecainide.  She is still working as a Clinical biochemist at [employer].  Today, she denies symptoms of shortness of breath, orthopnea, PND, lower extremity edema, claudication, dizziness, presyncope, syncope, bleeding, or neurologic sequela. The patient is tolerating medications without difficulties and is otherwise without complaint today.    Past Medical History  Diagnosis Date  . PVCs (premature ventricular contractions)   . Eczema   . Chronic tonsillitis 05/2011    mother denies sx. of sleep apnea  . GERD (gastroesophageal reflux disease)     Tried prilosec 5 yrs ago, no relief   Past Surgical History  Procedure Laterality Date  . Wisdom tooth extraction    . Tonsillectomy and adenoidectomy  06/02/2011    Procedure: TONSILLECTOMY AND ADENOIDECTOMY;  Surgeon: Flo Shanks, MD;  Location: [facility];  Service: ENT;  Laterality: N/A;  tonsilectomy and adenoid abliteration     Current Outpatient Prescriptions  Medication Sig Dispense Refill  . diltiazem (CARDIZEM CD) 120 MG 24 hr capsule Take 1 capsule (120 mg total) by mouth daily. 30 capsule 6  . flecainide (TAMBOCOR) 50 MG tablet Take 1 tablet (50 mg total) by mouth 2 (two) times daily. 60 tablet 3  . tretinoin (RETIN-A) 0.05 % cream Apply 1 application topically at bedtime. Reported on 02/12/2015  0   No current facility-administered medications for this visit.    Allergies:   Metoprolol tartrate   Social History:  The patient  reports that she has never smoked. She has never used  smokeless tobacco. She reports that she drinks alcohol. She reports that she does not use illicit drugs.   Family History:  The patient's  family history includes Cancer in her maternal grandfather; Hypertension in her maternal grandmother; Sudden death in her cousin.    ROS:  Please see the history of present illness.   All other systems are reviewed and negative.    PHYSICAL EXAM: VS:  BP 120/80 mmHg  Pulse 78  Ht 5' 6.5" (1.689 m)  Wt 167 lb (75.751 kg)  BMI 26.55 kg/m2 , BMI Body mass index is 26.55 kg/(m^2). GEN: Well nourished, well developed, in no acute distress HEENT: normal Neck: no JVD, carotid bruits, or masses Cardiac: RRR with ectopy; no murmurs, rubs, or gallops,no edema  Respiratory:  clear to auscultation bilaterally, normal work of breathing GI: soft, nontender, nondistended, + BS MS: no deformity or atrophy Skin: warm and dry  Neuro:  Strength and sensation are intact Psych: euthymic mood, full affect  EKG:  EKG is ordered today. The ekg ordered today shows sinus rhythm with PVCs (LBBB R inferior axis with precordial transition at V3)   Recent Labs: 02/12/2015: BUN 9; Creatinine, Ser 0.79; Hemoglobin 11.2*; Platelets 309; Potassium 3.7; Sodium 140    Lipid Panel  No results found for: CHOL, TRIG, HDL, CHOLHDL, VLDL, LDLCALC, LDLDIRECT   Wt Readings from Last 3 Encounters:  03/27/15 167 lb (75.751 kg)  02/16/15 167 lb 3.2 oz (75.841 kg)  02/12/15 167 lb (75.751 kg)      Other  studies Reviewed: Additional studies/ records that were reviewed today include: echo, holter, and ETT reviewed    ASSESSMENT AND PLAN:  1.  PVCs Doing well currently with outflow tract PVCs Structurally normal heart and normal ETT Therapeutic strategies for PVCs including medicine and ablation were discussed in detail with the patient today. She is clear that she would like to avoid ablation.  Continue current management going forward.     Current medicines are reviewed at  length with the patient today.   The patient does not have concerns regarding her medicines.  The following changes were made today:  none   Follow-up:  Return to see me in 6 months for further assessment   Signed, Hillis Range, MD  03/27/2015 1:59 PM     [facility] [address] [place] [address] [phone] (office) [phone] (fax)

## 2015-03-27 NOTE — Patient Instructions (Signed)

## 2015-03-30 ENCOUNTER — Ambulatory Visit: Payer: BLUE CROSS/BLUE SHIELD | Admitting: Internal Medicine

## 2015-05-29 ENCOUNTER — Ambulatory Visit (INDEPENDENT_AMBULATORY_CARE_PROVIDER_SITE_OTHER): Payer: BLUE CROSS/BLUE SHIELD | Admitting: Physician Assistant

## 2015-05-29 VITALS — BP 110/72 | HR 97 | Temp 98.7°F | Resp 18 | Wt 166.0 lb

## 2015-05-29 DIAGNOSIS — R52 Pain, unspecified: Secondary | ICD-10-CM

## 2015-05-29 DIAGNOSIS — B349 Viral infection, unspecified: Secondary | ICD-10-CM

## 2015-05-29 LAB — POCT CBC
GRANULOCYTE PERCENT: 50.3 % (ref 37–80)
HCT, POC: 38.2 % (ref 37.7–47.9)
HEMOGLOBIN: 13.5 g/dL (ref 12.2–16.2)
Lymph, poc: 1.2 (ref 0.6–3.4)
MCH: 32.4 pg — AB (ref 27–31.2)
MCHC: 35.3 g/dL (ref 31.8–35.4)
MCV: 91.7 fL (ref 80–97)
MID (CBC): 0.6 (ref 0–0.9)
MPV: 7.6 fL (ref 0–99.8)
PLATELET COUNT, POC: 234 10*3/uL (ref 142–424)
POC Granulocyte: 1.8 — AB (ref 2–6.9)
POC LYMPH PERCENT: 33.7 %L (ref 10–50)
POC MID %: 16 %M — AB (ref 0–12)
RBC: 4.16 M/uL (ref 4.04–5.48)
RDW, POC: 16.2 %
WBC: 3.5 10*3/uL — AB (ref 4.6–10.2)

## 2015-05-29 NOTE — Patient Instructions (Addendum)
Drink plenty of water (64 oz/day) and get plenty of rest. Take ibuprofen 600 mg three times a day for next 3-5 days. If your symptoms are not improving in 10-14 days or if at any time symptoms worsen, return to clinic.     IF you received an x-ray today, you will receive an invoice from [facility]. Please contact [facility] at [phone] with questions or concerns regarding your invoice.   IF you received labwork today, you will receive an invoice from [facility]. Please contact [facility] at [phone] with questions or concerns regarding your invoice.   Our billing staff will not be able to assist you with questions regarding bills from these companies.  You will be contacted with the lab results as soon as they are available. The fastest way to get your results is to activate your My Chart account. Instructions are located on the last page of this paperwork. If you have not heard from us regarding the results in 2 weeks, please contact this office.

## 2015-05-29 NOTE — Progress Notes (Signed)
[facility] [address], [place] [address] [phone]  Date:  05/29/2015   Name:  Sharon Shaw   DOB:  09/13/1990   MRN:  [id number]  PCP:  No PCP Per Patient    Chief Complaint: Fever and Generalized Body Aches   History of Present Illness:  This is a 25 y.o. female with PMH PVCs on diltiazem and flecainide who is presenting with body aches and fever x 2 days. States temp hovering around 99, nothing higher. She states today her neck starting hurting. Denies cough, sore throat, nasal congestion, otalgia.  Aggravating/alleviating factors: taking tylenol and helps for about an hour History of asthma: no History of env allergies: some seasonal allergies Tobacco use: no Godson was around 3 days ago with cold symptoms. No other sick contacts. 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   Review of Systems:  Review of Systems See HPI  Patient Active Problem List   Diagnosis Date Noted  . Leg pain 03/23/2014  . PVC's (premature ventricular contractions) 05/26/2011    Prior to Admission medications   Medication Sig Start Date End Date Taking? Authorizing Provider  diltiazem (CARDIZEM CD) 120 MG 24 hr capsule Take 1 capsule (120 mg total) by mouth daily. 02/13/15  Yes Marinus MawGregg W Taylor, MD  flecainide (TAMBOCOR) 50 MG tablet Take 1 tablet (50 mg total) by mouth 2 (two) times daily. 02/16/15  Yes Hillis RangeJames Allred, MD  tretinoin (RETIN-A) 0.05 % cream Apply 1 application topically at bedtime. Reported on 02/12/2015 01/17/14  Yes Historical Provider, MD    Allergies  Allergen Reactions  . Metoprolol Tartrate     Cause pt to have CP    Past Surgical History  Procedure Laterality Date  . Wisdom tooth extraction    . Tonsillectomy and adenoidectomy  06/02/2011    Procedure: TONSILLECTOMY AND ADENOIDECTOMY;  Surgeon: Flo ShanksKarol Wolicki, MD;  Location: [facility];  Service: ENT;  Laterality: N/A;  tonsilectomy and adenoid abliteration     Social History  Substance Use Topics  . Smoking status: Never Smoker   . Smokeless tobacco: Never Used  . Alcohol Use: 0.0 oz/week    0 Standard drinks or equivalent per week     Comment: occasionally    Family History  Problem Relation Age of Onset  . Sudden death Cousin     maternal cousin had mental retardation, cardiomyopathy and sudden death  . Hypertension Maternal Grandmother   . Cancer Maternal Grandfather     Medication list has been reviewed and updated.  Physical Examination:  Physical Exam  Constitutional: She is oriented to person, place, and time. She appears well-developed and well-nourished. No distress.  HENT:  Head: Normocephalic and atraumatic.  Right Ear: Hearing, tympanic membrane, external ear and ear canal normal.  Left Ear: Hearing, tympanic membrane, external ear and ear canal normal.  Nose: Nose normal.  Mouth/Throat: Uvula is midline, oropharynx is clear and moist and mucous membranes are normal.  Eyes: Conjunctivae and lids are normal. Right eye exhibits no discharge. Left eye exhibits no discharge. No scleral icterus.  Neck: Normal range of motion. Neck supple. Muscular tenderness (anterior neck bilateral and bilateral trapezius, see depiction) present. No rigidity. No Brudzinski's sign noted.    Cardiovascular: Normal rate, regular rhythm, normal heart sounds and normal pulses.   No murmur heard. Pulmonary/Chest: Effort normal and breath sounds normal. No respiratory distress. She has no wheezes. She has no rhonchi. She has no rales.  Musculoskeletal:  Normal range of motion.  Lymphadenopathy:       Head (right side): No submental, no submandibular and no tonsillar adenopathy present.       Head (left side): No submental, no submandibular and no tonsillar adenopathy present.    She has no cervical adenopathy.  Neurological: She is alert and oriented to person, place, and time.  Skin: Skin is warm, dry and intact. No lesion and no rash  noted.  Psychiatric: She has a normal mood and affect. Her speech is normal and behavior is normal. Thought content normal.    BP 110/72 mmHg  Pulse 97  Temp(Src) 98.7 F (37.1 C) (Oral)  Resp 18  Wt 166 lb (75.297 kg)  SpO2 100%  LMP 05/23/2015  Results for orders placed or performed in visit on 05/29/15  POCT CBC  Result Value Ref Range   WBC 3.5 (A) 4.6 - 10.2 K/uL   Lymph, poc 1.2 0.6 - 3.4   POC LYMPH PERCENT 33.7 10 - 50 %L   MID (cbc) 0.6 0 - 0.9   POC MID % 16.0 (A) 0 - 12 %M   POC Granulocyte 1.8 (A) 2 - 6.9   Granulocyte percent 50.3 37 - 80 %G   RBC 4.16 4.04 - 5.48 M/uL   Hemoglobin 13.5 12.2 - 16.2 g/dL   HCT, POC 40.9 81.1 - 47.9 %   MCV 91.7 80 - 97 fL   MCH, POC 32.4 (A) 27 - 31.2 pg   MCHC 35.3 31.8 - 35.4 g/dL   RDW, POC 91.4 %   Platelet Count, POC 234 142 - 424 K/uL   MPV 7.6 0 - 99.8 fL    Assessment and Plan:  1. Viral illness 2. Body aches CBC suggests viral illness. Neck pain without meningeal signs. Counseled on supportive care. Return in 10-14 days if symptoms do not improve or at any time if symptoms worsen.  - POCT CBC   [place] [employer]. Dyke Brackett, MHS [facility]  05/29/2015

## 2015-06-14 ENCOUNTER — Ambulatory Visit (INDEPENDENT_AMBULATORY_CARE_PROVIDER_SITE_OTHER): Payer: BLUE CROSS/BLUE SHIELD | Admitting: Family Medicine

## 2015-06-14 VITALS — BP 118/76 | HR 89 | Temp 98.7°F | Resp 16 | Ht 67.0 in | Wt 170.0 lb

## 2015-06-14 DIAGNOSIS — R202 Paresthesia of skin: Secondary | ICD-10-CM

## 2015-06-14 LAB — POCT CBC
Granulocyte percent: 52 %G (ref 37–80)
HEMATOCRIT: 32.6 % — AB (ref 37.7–47.9)
HEMOGLOBIN: 11.5 g/dL — AB (ref 12.2–16.2)
LYMPH, POC: 1.3 (ref 0.6–3.4)
MCH, POC: 32.2 pg — AB (ref 27–31.2)
MCHC: 35.3 g/dL (ref 31.8–35.4)
MCV: 91.2 fL (ref 80–97)
MID (cbc): 0.3 (ref 0–0.9)
MPV: 7.4 fL (ref 0–99.8)
POC Granulocyte: 1.8 — AB (ref 2–6.9)
POC LYMPH %: 38.2 % (ref 10–50)
POC MID %: 9.8 % (ref 0–12)
Platelet Count, POC: 291 10*3/uL (ref 142–424)
RBC: 3.57 M/uL — AB (ref 4.04–5.48)
RDW, POC: 16.3 %
WBC: 3.4 10*3/uL — AB (ref 4.6–10.2)

## 2015-06-14 NOTE — Patient Instructions (Addendum)
Your white count is below normal which likely indicates a virus  Please take ibuprofen 2-3 tablets every 8-12 hours as needed to see if your symptoms improve Please come back in if you are not better in 7-10 days or if you get worse    IF you received an x-ray today, you will receive an invoice from [facility]. Please contact [facility] at [phone] with questions or concerns regarding your invoice.   IF you received labwork today, you will receive an invoice from [facility]. Please contact [facility] at [phone] with questions or concerns regarding your invoice.   Our billing staff will not be able to assist you with questions regarding bills from these companies.  You will be contacted with the lab results as soon as they are available. The fastest way to get your results is to activate your My Chart account. Instructions are located on the last page of this paperwork. If you have not heard from us regarding the results in 2 weeks, please contact this office.

## 2015-06-14 NOTE — Progress Notes (Signed)
Subjective:    Patient ID: Sharon Shaw, female    DOB: 1990-07-29, 25 y.o.   MRN: [id number]  HPI This is a 25 yo female who presents today with 7 days of intermittent pain and itching of hands and feet. Has also noticed itching of scalp. Felt lips tingling one day. No swelling. Confined to palms and soles. Worse in the morning. Feels like it is getting worse. Pain worse after sitting for prolonged time. She was seen 05/29/15 with viral illness. She works at [employer].   Past Medical History  Diagnosis Date  . PVCs (premature ventricular contractions)   . Eczema   . Chronic tonsillitis 05/2011    mother denies sx. of sleep apnea  . GERD (gastroesophageal reflux disease)     Tried prilosec 5 yrs ago, no relief   Past Surgical History  Procedure Laterality Date  . Wisdom tooth extraction    . Tonsillectomy and adenoidectomy  06/02/2011    Procedure: TONSILLECTOMY AND ADENOIDECTOMY;  Surgeon: Flo ShanksKarol Wolicki, MD;  Location: [facility];  Service: ENT;  Laterality: N/A;  tonsilectomy and adenoid abliteration   Family History  Problem Relation Age of Onset  . Sudden death Cousin     maternal cousin had mental retardation, cardiomyopathy and sudden death  . Hypertension Maternal Grandmother   . Cancer Maternal Grandfather    Social History  Substance Use Topics  . Smoking status: Never Smoker   . Smokeless tobacco: Never Used  . Alcohol Use: 0.0 oz/week    0 Standard drinks or equivalent per week     Comment: occasionally     Review of Systems No fever or chills, no fatigue, no SOB, no palpitations    Objective:   Physical Exam  Constitutional: She is oriented to person, place, and time. She appears well-developed and well-nourished. No distress.  HENT:  Head: Normocephalic.  Eyes: Conjunctivae are normal.  Neck: Normal range of motion. Neck supple.  Cardiovascular: Normal rate, regular rhythm and normal heart sounds.     Pulmonary/Chest: Effort normal and breath sounds normal.  Musculoskeletal: Normal range of motion. She exhibits no edema or tenderness.  Lymphadenopathy:    She has no cervical adenopathy.  Neurological: She is alert and oriented to person, place, and time.  Skin: Skin is warm and dry. No rash noted. She is not diaphoretic. No erythema. No pallor.  Psychiatric: She has a normal mood and affect. Her behavior is normal. Judgment and thought content normal.  Vitals reviewed.     BP 118/76 mmHg  Pulse 89  Temp(Src) 98.7 F (37.1 C) (Oral)  Resp 16  Ht 5\' 7"  (1.702 m)  Wt 170 lb (77.111 kg)  BMI 26.62 kg/m2  SpO2 98%  LMP 05/23/2015 Results for orders placed or performed in visit on 06/14/15  POCT CBC  Result Value Ref Range   WBC 3.4 (A) 4.6 - 10.2 K/uL   Lymph, poc 1.3 0.6 - 3.4   POC LYMPH PERCENT 38.2 10 - 50 %L   MID (cbc) 0.3 0 - 0.9   POC MID % 9.8 0 - 12 %M   POC Granulocyte 1.8 (A) 2 - 6.9   Granulocyte percent 52.0 37 - 80 %G   RBC 3.57 (A) 4.04 - 5.48 M/uL   Hemoglobin 11.5 (A) 12.2 - 16.2 g/dL   HCT, POC 56.232.6 (A) 13.037.7 - 47.9 %   MCV 91.2 80 - 97 fL   MCH, POC 32.2 (A) 27 -  31.2 pg   MCHC 35.3 31.8 - 35.4 g/dL   RDW, POC 40.9 %   Platelet Count, POC 291 142 - 424 K/uL   MPV 7.4 0 - 99.8 fL      Assessment & Plan:  1. Paresthesia of both hands - POCT CBC - WBC slightly low, no focal findings on exam. Possible post viral syndrome - Discussed findings with patient, will treat with NSAIDs and if no improvement in 4-6 days RTC. If she develops rash, joint swelling or pain, I have asked her to return sooner.   2. Paresthesia of both feet - POCT CBC - see number 1  Olean Ree, FNP-BC  [facility]  06/17/2015 8:22 AM

## 2015-10-23 ENCOUNTER — Ambulatory Visit (HOSPITAL_COMMUNITY)
Admission: EM | Admit: 2015-10-23 | Discharge: 2015-10-23 | Disposition: A | Discharge status: Home / Self Care | Payer: BLUE CROSS/BLUE SHIELD | Attending: Family Medicine | Admitting: Family Medicine

## 2015-10-23 ENCOUNTER — Ambulatory Visit (INDEPENDENT_AMBULATORY_CARE_PROVIDER_SITE_OTHER): Payer: BLUE CROSS/BLUE SHIELD

## 2015-10-23 ENCOUNTER — Encounter (HOSPITAL_COMMUNITY): Payer: Self-pay | Admitting: *Deleted

## 2015-10-23 DIAGNOSIS — J069 Acute upper respiratory infection, unspecified: Secondary | ICD-10-CM

## 2015-10-23 MED ORDER — HYDROCOD POLST-CPM POLST ER 10-8 MG/5ML PO SUER: 5.0000 mL | Freq: Two times a day (BID) | ORAL | 1 refills | Status: DC | PRN

## 2015-10-23 MED ORDER — IPRATROPIUM BROMIDE 0.06 % NA SOLN: 2.0000 | Freq: Four times a day (QID) | NASAL | 1 refills | Status: DC

## 2015-10-23 NOTE — ED Triage Notes (Signed)
Pt reports  Symptoms  Of      Cough   /  Congested      Cough  Is  Productive  In the  Am       Pt  Is  Sitting  Upright  On the  Exam table   Speaking in   Complete  sentances   And  Is  In no  Acute  Distress

## 2015-10-23 NOTE — ED Provider Notes (Signed)
[facility]    CSN: [id number] Arrival date & time: 10/23/15  1252     History   Chief Complaint Chief Complaint  Patient presents with  . Cough    HPI Sharon Shaw is a 25 y.o. female.   The history is provided by the patient.  Cough  Cough characteristics:  Non-productive Severity:  Mild Onset quality:  Gradual Duration:  5 days Progression:  Unchanged Chronicity:  New Smoker: no   Context: sick contacts   Context comment:  Works in pediatric office Relieved by:  None tried Worsened by:  Nothing Ineffective treatments:  None tried Associated symptoms: rhinorrhea   Associated symptoms: no fever, no shortness of breath and no sinus congestion     Past Medical History:  Diagnosis Date  . Chronic tonsillitis 05/2011   mother denies sx. of sleep apnea  . Eczema   . GERD (gastroesophageal reflux disease)    Tried prilosec 5 yrs ago, no relief  . PVCs (premature ventricular contractions)     Patient Active Problem List   Diagnosis Date Noted  . Leg pain 03/23/2014  . PVC's (premature ventricular contractions) 05/26/2011    Past Surgical History:  Procedure Laterality Date  . TONSILLECTOMY AND ADENOIDECTOMY  06/02/2011   Procedure: TONSILLECTOMY AND ADENOIDECTOMY;  Surgeon: Flo Shanks, MD;  Location: [facility];  Service: ENT;  Laterality: N/A;  tonsilectomy and adenoid abliteration  . WISDOM TOOTH EXTRACTION      OB History    No data available       Home Medications    Prior to Admission medications   Medication Sig Start Date End Date Taking? Authorizing Provider  diltiazem (CARDIZEM CD) 120 MG 24 hr capsule Take 1 capsule (120 mg total) by mouth daily. 02/13/15   Marinus Maw, MD  flecainide (TAMBOCOR) 50 MG tablet Take 1 tablet (50 mg total) by mouth 2 (two) times daily. 02/16/15   Hillis Range, MD  minocycline (DYNACIN) 100 MG tablet Take 100 mg by mouth 2 (two) times daily.    Historical Provider, MD  tretinoin  (RETIN-A) 0.1 % cream Apply topically at bedtime.    Historical Provider, MD    Family History Family History  Problem Relation Age of Onset  . Hypertension Maternal Grandmother   . Cancer Maternal Grandfather   . Sudden death Cousin     maternal cousin had mental retardation, cardiomyopathy and sudden death    Social History Social History  Substance Use Topics  . Smoking status: Never Smoker  . Smokeless tobacco: Never Used  . Alcohol use 0.0 oz/week     Comment: occasionally     Allergies   Metoprolol tartrate   Review of Systems Review of Systems  Constitutional: Negative for fever.  HENT: Positive for rhinorrhea.   Respiratory: Positive for cough. Negative for shortness of breath.      Physical Exam Triage Vital Signs ED Triage Vitals  Enc Vitals Group     BP 10/23/15 1331 122/86     Pulse Rate 10/23/15 1331 83     Resp 10/23/15 1331 12     Temp 10/23/15 1331 98.5 F (36.9 C)     Temp Source 10/23/15 1331 Oral     SpO2 10/23/15 1331 100 %     Weight --      Height --      Head Circumference --      Peak Flow --      Pain Score 10/23/15 1423 6  Pain Loc --      Pain Edu? --      Excl. in GC? --    No data found.   Updated Vital Signs BP 122/86 (BP Location: Left Arm)   Pulse 83   Temp 98.5 F (36.9 C) (Oral)   Resp 12   LMP 10/01/2015   SpO2 100%   Visual Acuity Right Eye Distance:   Left Eye Distance:   Bilateral Distance:    Right Eye Near:   Left Eye Near:    Bilateral Near:     Physical Exam  Constitutional: She is oriented to person, place, and time. She appears well-developed and well-nourished. No distress.  HENT:  Right Ear: External ear normal.  Left Ear: External ear normal.  Mouth/Throat: Posterior oropharyngeal edema and posterior oropharyngeal erythema present. No oropharyngeal exudate.  Neck: Normal range of motion. Neck supple.  Cardiovascular: Normal rate.   Pulmonary/Chest: Effort normal and breath sounds  normal.  Lymphadenopathy:    She has no cervical adenopathy.  Neurological: She is alert and oriented to person, place, and time.  Skin: Skin is warm and dry.  Nursing note and vitals reviewed.    UC Treatments / Results  Labs (all labs ordered are listed, but only abnormal results are displayed) Labs Reviewed - No data to display  EKG  EKG Interpretation None       Radiology No results found. X-rays reviewed and report per radiologist.  Procedures Procedures (including critical care time)  Medications Ordered in UC Medications - No data to display   Initial Impression / Assessment and Plan / UC Course  I have reviewed the triage vital signs and the nursing notes.  Pertinent labs & imaging results that were available during my care of the patient were reviewed by me and considered in my medical decision making (see chart for details).  Clinical Course      Final Clinical Impressions(s) / UC Diagnoses   Final diagnoses:  None    New Prescriptions New Prescriptions   No medications on file     Linna HoffJames D Kindl, MD 10/23/15 1527

## 2016-02-18 ENCOUNTER — Encounter (HOSPITAL_COMMUNITY): Payer: Self-pay

## 2016-02-18 ENCOUNTER — Emergency Department (HOSPITAL_COMMUNITY): Payer: BLUE CROSS/BLUE SHIELD

## 2016-02-18 ENCOUNTER — Emergency Department (HOSPITAL_COMMUNITY)
Admission: EM | Admit: 2016-02-18 | Discharge: 2016-02-18 | Disposition: A | Discharge status: Home / Self Care | Payer: BLUE CROSS/BLUE SHIELD | Attending: Emergency Medicine | Admitting: Emergency Medicine

## 2016-02-18 DIAGNOSIS — R002 Palpitations: Secondary | ICD-10-CM | POA: Insufficient documentation

## 2016-02-18 DIAGNOSIS — R079 Chest pain, unspecified: Secondary | ICD-10-CM | POA: Insufficient documentation

## 2016-02-18 LAB — BASIC METABOLIC PANEL
Anion gap: 7 (ref 5–15)
BUN: 8 mg/dL (ref 6–20)
CO2: 25 mmol/L (ref 22–32)
Calcium: 9.3 mg/dL (ref 8.9–10.3)
Chloride: 106 mmol/L (ref 101–111)
Creatinine, Ser: 0.8 mg/dL (ref 0.44–1.00)
GFR calc Af Amer: >60 mL/min (ref >60)
GLUCOSE: 90 mg/dL (ref 65–99)
Potassium: 4 mmol/L (ref 3.5–5.1)
Sodium: 138 mmol/L (ref 135–145)

## 2016-02-18 LAB — CBC
HCT: 34.1 % — ABNORMAL LOW (ref 36.0–46.0)
Hemoglobin: 11.2 g/dL — ABNORMAL LOW (ref 12.0–15.0)
MCH: 31 pg (ref 26.0–34.0)
MCHC: 32.8 g/dL (ref 30.0–36.0)
MCV: 94.5 fL (ref 78.0–100.0)
Platelets: 314 10*3/uL (ref 150–400)
RBC: 3.61 MIL/uL — ABNORMAL LOW (ref 3.87–5.11)
RDW: 14 % (ref 11.5–15.5)
WBC: 3.2 10*3/uL — ABNORMAL LOW (ref 4.0–10.5)

## 2016-02-18 LAB — I-STAT TROPONIN, ED: Troponin i, poc: 0.01 ng/mL (ref 0.00–0.08)

## 2016-02-18 MED ORDER — FLECAINIDE ACETATE 50 MG PO TABS
50.0000 mg | ORAL_TABLET | ORAL | Status: AC
  Administered 2016-02-18: 50 mg via ORAL
  Filled 2016-02-18: qty 1

## 2016-02-18 MED ORDER — DILTIAZEM HCL ER COATED BEADS 120 MG PO CP24
120.0000 mg | ORAL_CAPSULE | ORAL | Status: AC
  Administered 2016-02-18: 120 mg via ORAL
  Filled 2016-02-18: qty 1

## 2016-02-18 MED ORDER — DILTIAZEM HCL ER COATED BEADS 120 MG PO CP24: 120.0000 mg | ORAL_CAPSULE | Freq: Every day | ORAL | 0 refills | Status: DC

## 2016-02-18 MED ORDER — FLECAINIDE ACETATE 50 MG PO TABS: 50.0000 mg | ORAL_TABLET | Freq: Two times a day (BID) | ORAL | 0 refills | Status: DC

## 2016-02-18 NOTE — ED Provider Notes (Signed)
[facility]   CSN: [id number] Arrival date & time: 02/18/16  1255   By signing my name below, I, Soijett Blue, attest that this documentation has been prepared under the direction and in the presence of Audry Pili, PA-C Electronically Signed: Soijett Blue, ED Scribe. 02/18/16. 6:24 PM.  History   Chief Complaint Chief Complaint  Patient presents with  . Chest Pain  . Palpitations    HPI Sharon Shaw is a 26 y.o. female with a PMHx of PVCs, who presents to the Emergency Department complaining of constant sternal CP onset 1:45 AM yesterday. She notes that she was washing her face when the sternal CP began. Pt is having associated symptoms of diaphoresis, nausea, and palpitations. Pt reports that she was evaluated by electrophysiologist, Dr. Johney Frame for similar symptoms last year and prescribed flecainide. Pt reports that she attempted to call Dr. Jenel Lucks office today for an appointment, but the line was busy. She states that she has tried 3 doses of flecainide with relief for her symptoms. She denies vomiting, cough, nasal congestion, fever, and any other symptoms. Denies PMHx or family hx of MI. Denies hx of DM, HTN, high cholesterol, PE, DVT, recent travel, recent surgeries, or estrogen supplements. 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   Per pt chart review: Pt was seen at her electrophysiologist, Dr. Johney Frame on 03/27/2015 for similar symptoms. Pt was prescribed 50 mg flecainide BID. Pt has NOT been taking Cardizem on regular basis as prescribed and has been taking Flecanide PRN instead of BID. This is due to cost issues.    The history is provided by the patient. No language interpreter was used.    Past Medical History:  Diagnosis Date  . Chronic tonsillitis 05/2011   mother denies sx. of sleep apnea  . Eczema   . GERD (gastroesophageal reflux disease)    Tried prilosec 5 yrs ago, no relief  . PVCs (premature ventricular contractions)     Patient Active Problem  List   Diagnosis Date Noted  . Leg pain 03/23/2014  . PVC's (premature ventricular contractions) 05/26/2011    Past Surgical History:  Procedure Laterality Date  . TONSILLECTOMY AND ADENOIDECTOMY  06/02/2011   Procedure: TONSILLECTOMY AND ADENOIDECTOMY;  Surgeon: Flo Shanks, MD;  Location: [facility];  Service: ENT;  Laterality: N/A;  tonsilectomy and adenoid abliteration  . WISDOM TOOTH EXTRACTION      OB History    No data available       Home Medications    Prior to Admission medications   Medication Sig Start Date End Date Taking? Authorizing Provider  chlorpheniramine-HYDROcodone (TUSSIONEX PENNKINETIC ER) 10-8 MG/5ML SUER Take 5 mLs by mouth every 12 (twelve) hours as needed for cough. 10/23/15   Linna Hoff, MD  diltiazem (CARDIZEM CD) 120 MG 24 hr capsule Take 1 capsule (120 mg total) by mouth daily. 02/13/15   Marinus Maw, MD  flecainide (TAMBOCOR) 50 MG tablet Take 1 tablet (50 mg total) by mouth 2 (two) times daily. 02/16/15   Hillis Range, MD  ipratropium (ATROVENT) 0.06 % nasal spray Place 2 sprays into both nostrils 4 (four) times daily. 10/23/15   Linna Hoff, MD  minocycline (DYNACIN) 100 MG tablet Take 100 mg by mouth 2 (two) times daily.    Historical Provider, MD  tretinoin (RETIN-A) 0.1 % cream Apply topically at bedtime.    Historical Provider, MD    Family History Family History  Problem Relation Age of Onset  .  Hypertension Maternal Grandmother   . Cancer Maternal Grandfather   . Sudden death Cousin     maternal cousin had mental retardation, cardiomyopathy and sudden death    Social History Social History  Substance Use Topics  . Smoking status: Never Smoker  . Smokeless tobacco: Never Used  . Alcohol use 0.0 oz/week     Comment: occasionally     Allergies   Metoprolol tartrate   Review of Systems Review of Systems  Constitutional: Positive for diaphoresis. Negative for fever.  HENT: Negative for congestion.     Respiratory: Negative for cough.   Cardiovascular: Positive for chest pain (sternal) and palpitations.  Gastrointestinal: Positive for nausea. Negative for vomiting.    A complete 10 system review of systems was obtained and all systems are negative except as noted in the HPI and PMH.   Physical Exam Updated Vital Signs BP 133/86 (BP Location: Left Arm)   Pulse 75   Temp 99.2 F (37.3 C) (Oral)   Resp 18   LMP 02/12/2016 (Exact Date)   SpO2 100%   Physical Exam  Constitutional: She is oriented to person, place, and time. She appears well-developed and well-nourished. No distress.  HENT:  Head: Normocephalic and atraumatic.  Eyes: EOM are normal.  Neck: Neck supple.  Cardiovascular: Normal rate, regular rhythm and normal heart sounds.  Exam reveals no gallop and no friction rub.   No murmur heard. Distal pulses to BUE intact.  Pulmonary/Chest: Effort normal and breath sounds normal. No respiratory distress. She has no wheezes. She has no rales. She exhibits tenderness.  Reproducible sternal CP.  Abdominal: She exhibits no distension.  Musculoskeletal: Normal range of motion.  Neurological: She is alert and oriented to person, place, and time.  Skin: Skin is warm and dry.  Psychiatric: She has a normal mood and affect. Her behavior is normal.  Nursing note and vitals reviewed.  ED Treatments / Results  DIAGNOSTIC STUDIES: Oxygen Saturation is 100% on RA, nl by my interpretation.    COORDINATION OF CARE: 6:20 PM Discussed treatment plan with pt at bedside which includes labs, EKG, CXR, and pt agreed to plan.   Labs (all labs ordered are listed, but only abnormal results are displayed) Labs Reviewed  CBC - Abnormal; Notable for the following:       Result Value   WBC 3.2 (*)    RBC 3.61 (*)    Hemoglobin 11.2 (*)    HCT 34.1 (*)    All other components within normal limits  BASIC METABOLIC PANEL  I-STAT TROPOININ, ED    EKG  EKG Interpretation None        Radiology Dg Chest 2 View  Result Date: 02/18/2016 CLINICAL DATA:  Chest pain.  Nausea.  10/23/2015. EXAM: CHEST  2 VIEW COMPARISON:  10/23/2015. FINDINGS: Mediastinum and hilar structures are normal. Tiny calcified pulmonary nodules noted most likely granulomas. Lungs are clear of acute infiltrates. No pleural effusion or pneumothorax. Heart size normal. No acute bony abnormality. IMPRESSION: No acute cardiopulmonary disease. Electronically Signed   By: Maisie Fushomas  Register   On: 02/18/2016 14:31    Procedures Procedures (including critical care time)  Medications Ordered in ED Medications  diltiazem (CARDIZEM CD) 24 hr capsule 120 mg (120 mg Oral Given 02/18/16 1932)  flecainide (TAMBOCOR) tablet 50 mg (50 mg Oral Given 02/18/16 1932)     Initial Impression / Assessment and Plan / ED Course  I have reviewed the triage vital signs and the nursing notes.  Pertinent labs & imaging results that were available during my care of the patient were reviewed by me and considered in my medical decision making (see chart for details).   I have reviewed and evaluated the relevant laboratory values. I have reviewed and evaluated the relevant imaging studies. I have interpreted the relevant EKG. I have reviewed the relevant previous healthcare records. I obtained HPI from historian. Patient discussed with supervising physician.  ED Course:  Assessment:  Pt is a 25yF with hx PVCs who presents with symptomatic PVCs with associated chest pain x 2 days ago. Hx same. Treated with Flecanide and Cardizem by Cardiologist. Pt has been taking both medications on an a PRN basis due to cost issues. Rxed supposedly Cardizem QD as well as Flecanide BID. On exam, pt in NAD. Nontoxic/nonseptic appearing. VSS. Afebrile. Lungs CTA. Heart RRR. Abdomen nontender soft. Labs unremarkable. Trop negative. EKG shows PVCs without acute abnormality. CXR unremarkable. Given flecanide and cardizem in ED. Consult to case management  for medication assistance. Plan is to DC home with follow up to Cardiology.. At time of discharge, Patient is in no acute distress. Vital Signs are stable. Patient is able to ambulate. Patient able to tolerate PO.   Disposition/Plan:  DC Home Additional Verbal discharge instructions given and discussed with patient.  Pt Instructed to f/u with Cardiology in the next week for evaluation and treatment of symptoms. Return precautions given Pt acknowledges and agrees with plan  Supervising Physician Rolland Porter, MD  Final Clinical Impressions(s) / ED Diagnoses   Final diagnoses:  Palpitations  Chest pain, unspecified type    New Prescriptions New Prescriptions   No medications on file   I personally performed the services described in this documentation, which was scribed in my presence. The recorded information has been reviewed and is accurate.     Audry Pili, PA-C 02/18/16 1952    Rolland Porter, MD 02/28/16 [phone]

## 2016-02-18 NOTE — Discharge Instructions (Signed)
Please read and follow all provided instructions.  Your diagnoses today include:  1. Palpitations   2. Chest pain, unspecified type     Tests performed today include: An EKG of your heart A chest x-ray Cardiac enzymes - a blood test for heart muscle damage Blood counts and electrolytes Vital signs. See below for your results today.   Medications prescribed:   Take any prescribed medications only as directed.  Follow-up instructions: Please follow-up with your primary care provider as soon as you can for further evaluation of your symptoms.   Return instructions:  SEEK IMMEDIATE MEDICAL ATTENTION IF: You have severe chest pain, especially if the pain is crushing or pressure-like and spreads to the arms, back, neck, or jaw, or if you have sweating, nausea (feeling sick to your stomach), or shortness of breath. THIS IS AN EMERGENCY. Don't wait to see if the pain will go away. Get medical help at once. Call 911 or 0 (operator). DO NOT drive yourself to the hospital.  Your chest pain gets worse and does not go away with rest.  You have an attack of chest pain lasting longer than usual, despite rest and treatment with the medications your caregiver has prescribed.  You wake from sleep with chest pain or shortness of breath. You feel dizzy or faint. You have chest pain not typical of your usual pain for which you originally saw your caregiver.  You have any other emergent concerns regarding your health.  Additional Information: Chest pain comes from many different causes. Your caregiver has diagnosed you as having chest pain that is not specific for one problem, but does not require admission.  You are at low risk for an acute heart condition or other serious illness.   Your vital signs today were: BP 129/90 (BP Location: Right Arm)    Pulse 77    Temp 98.2 F (36.8 C) (Oral)    Resp 19    LMP 02/12/2016 (Exact Date)    SpO2 100%  If your blood pressure (BP) was elevated above 135/85  this visit, please have this repeated by your doctor within one month. --------------

## 2016-02-18 NOTE — ED Triage Notes (Signed)
Pt reports having chest pain and palpations at work today. Hx of PVC's. She takes flecainide for the palpitations and took 3 without relief.

## 2017-05-08 IMAGING — CR DG CHEST 2V
2 series · 2 of 2 positions shown · non-contrast
Comparison: None.

CLINICAL DATA: Acute onset of shortness of breath. Initial
encounter.

EXAM:
CHEST  2 VIEW

[PA]
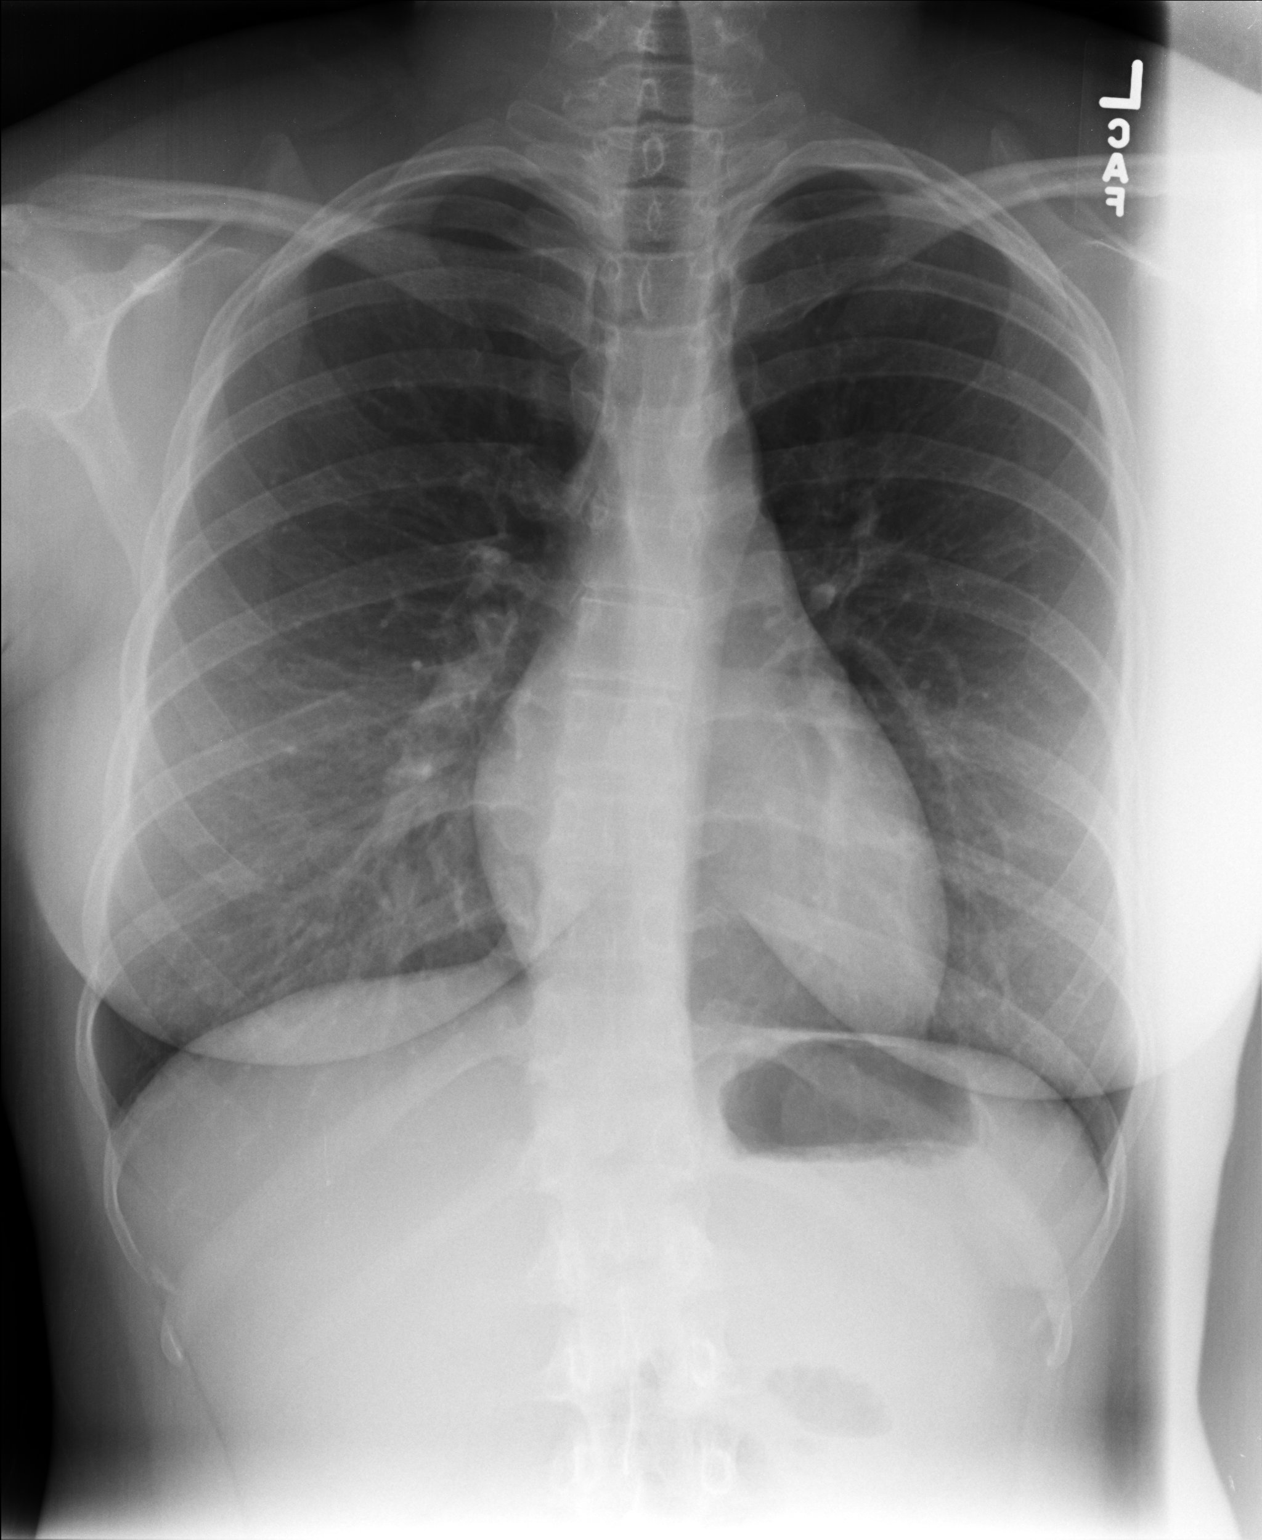

[lateral]
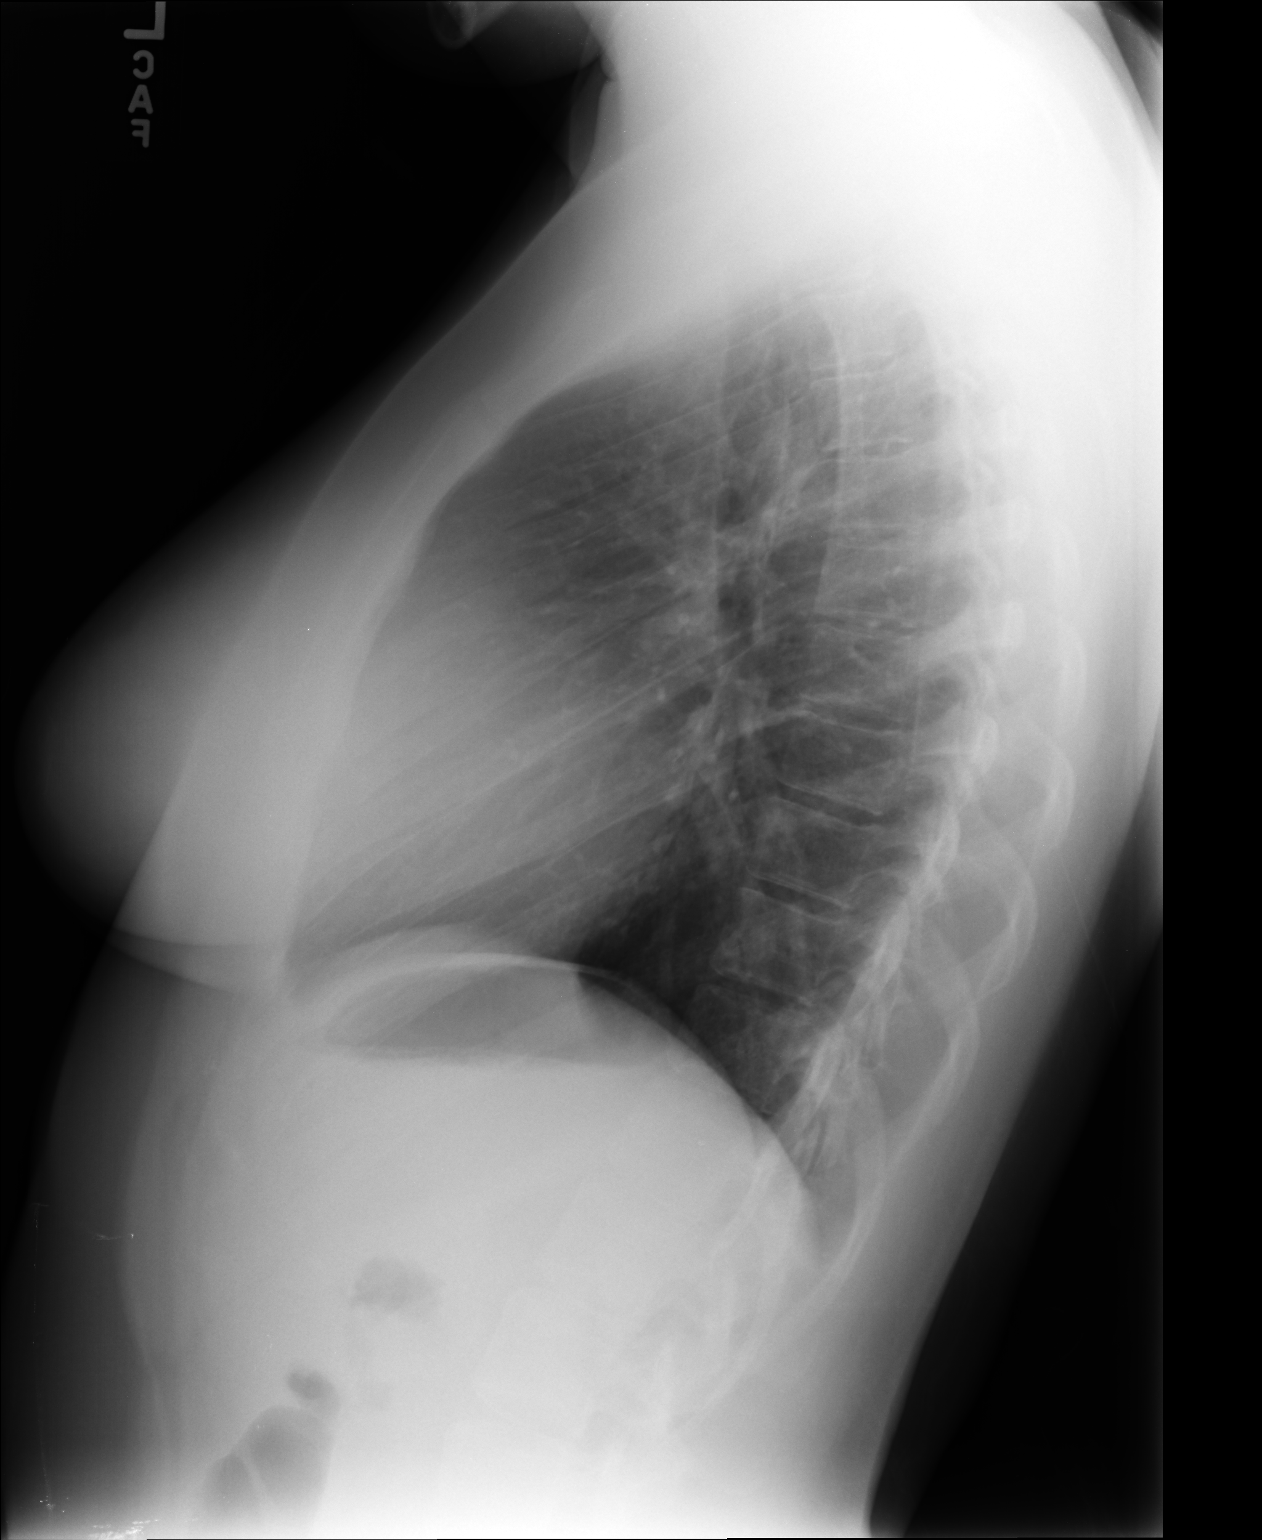

[2 of 2 positions shown; findings below may reference images not displayed]

FINDINGS: The lungs are well-aerated and clear. There is no evidence of focal
opacification, pleural effusion or pneumothorax.

The heart is normal in size; the mediastinal contour is within
normal limits. No acute osseous abnormalities are seen.
IMPRESSION: No acute cardiopulmonary process seen.

## 2018-07-16 ENCOUNTER — Telehealth: Payer: Self-pay | Admitting: General Practice

## 2018-07-16 NOTE — Telephone Encounter (Signed)
Called pt to schedule new patient appointment with Orland Mustard. LVMTCB

## 2018-08-27 ENCOUNTER — Ambulatory Visit (HOSPITAL_COMMUNITY)
Admission: EM | Admit: 2018-08-27 | Discharge: 2018-08-27 | Disposition: A | Discharge status: Home / Self Care | Payer: 59 | Attending: Emergency Medicine | Admitting: Emergency Medicine

## 2018-08-27 ENCOUNTER — Other Ambulatory Visit: Payer: Self-pay

## 2018-08-27 ENCOUNTER — Encounter (HOSPITAL_COMMUNITY): Payer: Self-pay | Admitting: Emergency Medicine

## 2018-08-27 DIAGNOSIS — R002 Palpitations: Secondary | ICD-10-CM | POA: Diagnosis not present

## 2018-08-27 DIAGNOSIS — R51 Headache: Secondary | ICD-10-CM

## 2018-08-27 DIAGNOSIS — R519 Headache, unspecified: Secondary | ICD-10-CM

## 2018-08-27 DIAGNOSIS — R110 Nausea: Secondary | ICD-10-CM | POA: Diagnosis not present

## 2018-08-27 NOTE — ED Triage Notes (Signed)
Pt presents to [facility] for assessment of palpitations, light-headedness, nausea, elevated BP (130/70) and headache, all sudden onset approx 2 hours ago and still continuing now.  Hx of PVC, but states normally asymptomatic.

## 2018-08-27 NOTE — ED Notes (Signed)
Patient able to ambulate independently  

## 2018-08-27 NOTE — ED Provider Notes (Signed)
[facility]    CSN: [id number] Arrival date & time: 08/27/18  1547     History   Chief Complaint Chief Complaint  Patient presents with  . Palpitations    HPI Sharon Shaw is a 28 y.o. female.  Patient reports 1 hour episode of heart palpitations, nausea, lightheadedness, headache; this occurred approximately 2 hours PTA.  She has a history of PVCs and was followed by cardiologist until she lost her health insurance; she is not currently taking any medications; she reports she has had an Holter monitor x2 in the past.  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  She states she has a mild residual headache which is generalized but no other symptoms.  She denies chest pain, shortness of breath, nausea, weakness, dizziness, current heart palpitations, or other symptoms.  LMP: 08/07/2018.     The history is provided by the patient.    Past Medical History:  Diagnosis Date  . Chronic tonsillitis 05/2011   mother denies sx. of sleep apnea  . Eczema   . GERD (gastroesophageal reflux disease)    Tried prilosec 5 yrs ago, no relief  . PVCs (premature ventricular contractions)     Patient Active Problem List   Diagnosis Date Noted  . Leg pain 03/23/2014  . PVC's (premature ventricular contractions) 05/26/2011    Past Surgical History:  Procedure Laterality Date  . TONSILLECTOMY AND ADENOIDECTOMY  06/02/2011   Procedure: TONSILLECTOMY AND ADENOIDECTOMY;  Surgeon: Flo ShanksKarol Wolicki, MD;  Location:  [facility];  Service: ENT;  Laterality: N/A;  tonsilectomy and adenoid abliteration  . WISDOM TOOTH EXTRACTION      OB History   No obstetric history on file.      Home Medications    Prior to Admission medications   Medication Sig Start Date End Date Taking? Authorizing Provider  chlorpheniramine-HYDROcodone (TUSSIONEX PENNKINETIC ER) 10-8  MG/5ML SUER Take 5 mLs by mouth every 12 (twelve) hours as needed for cough. 10/23/15   Linna HoffKindl, James D, MD  diltiazem (CARDIZEM CD) 120 MG 24 hr capsule Take 1 capsule (120 mg total) by mouth daily. 02/18/16   Audry PiliMohr, Tyler, PA-C  flecainide (TAMBOCOR) 50 MG tablet Take 1 tablet (50 mg total) by mouth 2 (two) times daily. 02/18/16   Audry PiliMohr, Tyler, PA-C  ipratropium (ATROVENT) 0.06 % nasal spray Place 2 sprays into both nostrils 4 (four) times daily. 10/23/15   Linna HoffKindl, James D, MD  minocycline (DYNACIN) 100 MG tablet Take 100 mg by mouth 2 (two) times daily.    [provider]  tretinoin (RETIN-A) 0.1 % cream Apply topically at bedtime.    [provider]    Family History Family History  Problem Relation Age of Onset  . Hypertension Maternal Grandmother   . Cancer Maternal Grandfather   . Sudden death Cousin        maternal cousin had mental retardation, cardiomyopathy and sudden death    Social History Social History   Tobacco Use  . Smoking status: Never Smoker  . Smokeless tobacco: Never Used  Substance Use Topics  . Alcohol use: Yes    Alcohol/week: 0.0 standard drinks    Comment: occasionally  . Drug use: No     Allergies   Metoprolol tartrate   Review of Systems Review of Systems  Constitutional: Negative for chills and fever.  HENT: Negative for ear  pain and sore throat.   Eyes: Negative for pain and visual disturbance.  Respiratory: Negative for cough and shortness of breath.   Cardiovascular: Positive for palpitations. Negative for chest pain and leg swelling.  Gastrointestinal: Positive for nausea. Negative for abdominal pain and vomiting.  Genitourinary: Negative for dysuria and hematuria.  Musculoskeletal: Negative for arthralgias and back pain.  Skin: Negative for color change and rash.  Neurological: Positive for light-headedness and headaches. Negative for dizziness, seizures, syncope, facial asymmetry, speech difficulty, weakness and numbness.   All other systems reviewed and are negative.    Physical Exam Triage Vital Signs ED Triage Vitals [08/27/18 1601]  Enc Vitals Group     BP 117/76     Pulse Rate 89     Resp 18     Temp 97.9 F (36.6 C)     Temp Source Oral     SpO2 100 %     Weight      Height      Head Circumference      Peak Flow      Pain Score 3     Pain Loc      Pain Edu?      Excl. in [place]?    No data found.  Updated Vital Signs BP 117/76 (BP Location: Left Arm)   Pulse 89   Temp 97.9 F (36.6 C) (Oral)   Resp 18   LMP 08/07/2018   SpO2 100%   Visual Acuity Right Eye Distance:   Left Eye Distance:   Bilateral Distance:    Right Eye Near:   Left Eye Near:    Bilateral Near:     Physical Exam Vitals signs and nursing note reviewed.  Constitutional:      General: She is not in acute distress.    Appearance: She is well-developed.  HENT:     Head: Normocephalic and atraumatic.     Mouth/Throat:     Mouth: Mucous membranes are moist.  Eyes:     Conjunctiva/sclera: Conjunctivae normal.  Neck:     Musculoskeletal: Neck supple.  Cardiovascular:     Rate and Rhythm: Normal rate and regular rhythm.     Heart sounds: Normal heart sounds.  Pulmonary:     Effort: Pulmonary effort is normal. No respiratory distress.     Breath sounds: Normal breath sounds.  Abdominal:     General: Bowel sounds are normal.     Palpations: Abdomen is soft.     Tenderness: There is no abdominal tenderness.  Musculoskeletal:     Right lower leg: No edema.     Left lower leg: No edema.  Skin:    General: Skin is warm and dry.  Neurological:     General: No focal deficit present.     Mental Status: She is alert and oriented to person, place, and time.     Sensory: No sensory deficit.     Motor: No weakness.     Coordination: Coordination normal.     Gait: Gait normal.     Deep Tendon Reflexes: Reflexes normal.      UC Treatments / Results  Labs (all labs ordered are listed, but only abnormal  results are displayed) Labs Reviewed - No data to display  EKG   Radiology No results found.  Procedures Procedures (including critical care time)  Medications Ordered in UC Medications - No data to display  Initial Impression / Assessment and Plan / UC Course  I have reviewed the triage  vital signs and the nursing notes.  Pertinent labs & imaging results that were available during my care of the patient were reviewed by me and considered in my medical decision making (see chart for details).   Heart palpitations, currently resolved.  Headache, generalized and improved.  EKG unremarkable; NSR with rate of 85.  Strict instructions given to patient to go to the emergency department if her symptoms return, including heart palpitations, dizziness, lightheadedness, weakness, chest pain, shortness of breath, nausea, vomiting, diaphoresis, or other symptoms.  Instructed patient to follow-up with her cardiologist on Monday.     Final Clinical Impressions(s) / UC Diagnoses   Final diagnoses:  Heart palpitations     Discharge Instructions     Your EKG is normal at this time.    Go to the emergency department right away if you develop symptoms again, including heart palpitations, dizziness, weakness, chest pain, shortness of breath, nausea, vomiting, unusual sweating, or other symptoms.          ED Prescriptions    None     Controlled Substance Prescriptions [place] Controlled Substance Registry consulted? Not Applicable   Mickie Bailate, Cydnee Fuquay H, NP 08/27/18 [id number]

## 2018-08-27 NOTE — Discharge Instructions (Addendum)
Your EKG is normal at this time.    Go to the emergency department right away if you develop symptoms again, including heart palpitations, dizziness, weakness, chest pain, shortness of breath, nausea, vomiting, unusual sweating, or other symptoms.    Follow up with your cardiologist on Monday.

## 2018-08-30 ENCOUNTER — Telehealth: Payer: Self-pay | Admitting: *Deleted

## 2018-08-30 NOTE — Telephone Encounter (Signed)
A message was left, re: schedule change 

## 2018-09-06 ENCOUNTER — Telehealth: Payer: 59 | Admitting: Internal Medicine

## 2018-09-22 NOTE — Progress Notes (Signed)
Cardiology Office Note   Date:  09/23/2018   ID:  Sharon Shaw, DOB 1990/06/15, MRN [id number]  PCP:  Patient, No Pcp Per  Cardiologist:   No primary care provider on file. Referring:  ED  Chief Complaint  Patient presents with  . Palpitations      History of Present Illness: Sharon Shaw is a 28 y.o. female who presents for presyncope.  On July 31 she was at work.  She became sweaty and lightheaded.  She became nauseated.  Her blood pressure went up.  She actually has noticed some palpitations right before that.  These are similar to the palpitations she had before. She was seen in Feb of 2017 by Dr. Johney FrameAllred for palpitations and she had PVCs.   She was treated with Flecainide.   At that time she had a normal EF on echo.  She had a normal POET (Plain Old Exercise Treadmill).  Holter demonstrated  4% PVCs.  She been on flecainide daily and then per Dr. Jenel LucksAllred's recommendation she went to as needed and then she stopped completely a few years ago.  She is rarely had palpitations prior to this event in July but now she is having them more frequently.  They were happening every few days.  This seemed to have gone away over the last few days.  She feels her heart racing or skipping.  She might get a little lightheaded.  She was able to bring these on.  She does not have any chest pressure, neck or arm discomfort.  She has no new shortness of breath, PND orthopnea.  I did review urgent care records for this visit.  She had no acute findings on EKG.  There were no arrhythmias noted when she was there.  Blood pressure was fine.    Past Medical History:  Diagnosis Date  . Chronic tonsillitis 05/2011   mother denies sx. of sleep apnea  . Eczema   . GERD (gastroesophageal reflux disease)    Tried prilosec 5 yrs ago, no relief  . PVCs (premature ventricular contractions)     Past Surgical History:  Procedure Laterality Date  . TONSILLECTOMY AND ADENOIDECTOMY  06/02/2011   Procedure: TONSILLECTOMY AND ADENOIDECTOMY;  Surgeon: Flo ShanksKarol Wolicki, MD;  Location: [facility];  Service: ENT;  Laterality: N/A;  tonsilectomy and adenoid abliteration  . WISDOM TOOTH EXTRACTION       Current Outpatient Medications  Medication Sig Dispense Refill  . diltiazem (CARDIZEM) 30 MG tablet TAKE 1 TABLET BY MOUTH TWICE DAILY AS NEEDED 60 tablet 3  . flecainide (TAMBOCOR) 50 MG tablet TAKE 1 TABLET TWICE DAILY AS NEEDED 60 tablet 3   No current facility-administered medications for this visit.     Allergies:   Metoprolol tartrate    Social History:  The patient  reports that she has never smoked. She has never used smokeless tobacco. She reports current alcohol use. She reports that she does not use drugs.   Family History:  The patient's family history includes Cancer in her maternal grandfather; Hypertension in her maternal grandmother; Sudden death in her cousin.  There is a vague history and her cousin of having sudden death in her 6340s but this might of been a second cousin.   ROS:  Please see the history of present illness.   Otherwise, review of systems are positive for none.   All other systems are reviewed and negative.    PHYSICAL EXAM: VS:  BP 120/70   Pulse  83   Temp 98.1 F (36.7 C)   Ht 5' 7.5" (1.715 m)   Wt 192 lb (87.1 kg)   SpO2 99%   BMI 29.63 kg/m  , BMI Body mass index is 29.63 kg/m. GENERAL:  Well appearing HEENT:  Pupils equal round and reactive, fundi not visualized, oral mucosa unremarkable NECK:  No jugular venous distention, waveform within normal limits, carotid upstroke brisk and symmetric, no bruits, no thyromegaly LYMPHATICS:  No cervical, inguinal adenopathy LUNGS:  Clear to auscultation bilaterally BACK:  No CVA tenderness CHEST:  Unremarkable HEART:  PMI not displaced or sustained,S1 and S2 within normal limits, no S3, no S4, no clicks, no rubs, no murmurs ABD:  Flat, positive bowel sounds normal in frequency in pitch,  no bruits, no rebound, no guarding, no midline pulsatile mass, no hepatomegaly, no splenomegaly EXT:  2 plus pulses throughout, no edema, no cyanosis no clubbing SKIN:  No rashes no nodules NEURO:  Cranial nerves II through XII grossly intact, motor grossly intact throughout PSYCH:  Cognitively intact, oriented to person place and time    EKG:  EKG is not ordered today. The ekg ordered 08/27/18 demonstrates sinus rhythm, rate 85, axis within normal limits, intervals within normal limits, nonspecific inferior T wave flattening   Recent Labs: No results found for requested labs within last 8760 hours.    Lipid Panel No results found for: CHOL, TRIG, HDL, CHOLHDL, VLDL, LDLCALC, LDLDIRECT    Wt Readings from Last 3 Encounters:  09/23/18 192 lb (87.1 kg)  06/14/15 170 lb (77.1 kg)  05/29/15 166 lb (75.3 kg)      Other studies Reviewed: Additional studies/ records that were reviewed today include: Urgent care records, Old EP records. Review of the above records demonstrates:  Please see elsewhere in the note.     ASSESSMENT AND PLAN:  PVCs:   The patient had documented symptomatic PVCs in the past.  She sounds like she is having these now.  These were adequately treated with flecainide before and she had had an otherwise unremarkable echo and a normal POET (Plain Old Exercise Treadmill).  I think would be quite safe to try the flecainide again.  She can get Cardizem 30 mg twice daily to start with if she has palpitations.  She might take it per day and then start the flecainide 50 twice daily for a day or so if she is having the palpitations.  She will let me know if the symptoms worsen and if that therapy does not work.  She had normal thyroid studies last year in September.  I told her that if she has increasing palpitations she would need to have these repeated.  She would need also have electrolytes.  PRESYNCOPE: It is unclear what the event was in July.  It may or might not have  been related to the rhythm but could have been a vagal episode as well.  She is not having any further symptoms.  No change in therapy is indicated other than above.   Current medicines are reviewed at length with the patient today.  The patient does not have concerns regarding medicines.  The following changes have been made:  no change  Labs/ tests ordered today include: None No orders of the defined types were placed in this encounter.    Disposition:   FU with me in one year.     Signed, Minus Breeding, MD  09/23/2018 6:05 PM    Martha

## 2018-09-23 ENCOUNTER — Other Ambulatory Visit: Payer: Self-pay

## 2018-09-23 ENCOUNTER — Ambulatory Visit (INDEPENDENT_AMBULATORY_CARE_PROVIDER_SITE_OTHER): Payer: Self-pay | Admitting: Cardiology

## 2018-09-23 ENCOUNTER — Encounter: Payer: Self-pay | Admitting: Cardiology

## 2018-09-23 VITALS — BP 120/70 | HR 83 | Temp 98.1°F | Ht 67.5 in | Wt 192.0 lb

## 2018-09-23 DIAGNOSIS — I493 Ventricular premature depolarization: Secondary | ICD-10-CM

## 2018-09-23 DIAGNOSIS — R42 Dizziness and giddiness: Secondary | ICD-10-CM

## 2018-09-23 MED ORDER — FLECAINIDE ACETATE 50 MG PO TABS: ORAL_TABLET | ORAL | 3 refills | Status: AC

## 2018-09-23 MED ORDER — DILTIAZEM HCL 30 MG PO TABS: ORAL_TABLET | ORAL | 3 refills | Status: AC

## 2018-09-23 NOTE — Patient Instructions (Signed)
Medication Instructions:  TAKE FLECAINIDE 50 MG TWICE A DAY AS NEEDED  TAKE CARDIZEM 30 MG TWICE A DAY AS NEEDED   If you need a refill on your cardiac medications before your next appointment, please call your pharmacy.   Lab work: NONE   Testing/Procedures: NONE  Follow-Up: At [facility], you and your health needs are our priority.  As part of our continuing mission to provide you with exceptional heart care, we have created designated Provider Care Teams.  These Care Teams include your primary Cardiologist (physician) and Advanced Practice Providers (APPs -  Physician Assistants and Nurse Practitioners) who all work together to provide you with the care you need, when you need it. You will need a follow up appointment in 12 months.  Please call our office 2 months in advance to schedule this appointment.  You may see DR [facility]  or one of the following Advanced Practice Providers on your designated Care Team:   Rosaria Ferries, PA-C . Jory Sims, DNP, ANP

## 2018-11-30 ENCOUNTER — Other Ambulatory Visit: Payer: Self-pay | Admitting: Family Medicine

## 2018-11-30 ENCOUNTER — Other Ambulatory Visit: Payer: Self-pay

## 2018-11-30 DIAGNOSIS — Z20822 Contact with and (suspected) exposure to covid-19: Secondary | ICD-10-CM

## 2018-12-01 LAB — NOVEL CORONAVIRUS, NAA: SARS-CoV-2, NAA: NOT DETECTED

## 2019-04-06 ENCOUNTER — Other Ambulatory Visit: Payer: Self-pay

## 2019-04-06 ENCOUNTER — Encounter (HOSPITAL_COMMUNITY): Payer: Self-pay

## 2019-04-06 ENCOUNTER — Ambulatory Visit (HOSPITAL_COMMUNITY)
Admission: EM | Admit: 2019-04-06 | Discharge: 2019-04-06 | Disposition: A | Discharge status: Home / Self Care | Payer: Self-pay | Attending: Family Medicine | Admitting: Family Medicine

## 2019-04-06 DIAGNOSIS — K5901 Slow transit constipation: Secondary | ICD-10-CM

## 2019-04-06 DIAGNOSIS — R1084 Generalized abdominal pain: Secondary | ICD-10-CM

## 2019-04-06 DIAGNOSIS — R140 Abdominal distension (gaseous): Secondary | ICD-10-CM

## 2019-04-06 MED ORDER — DICYCLOMINE HCL 20 MG PO TABS: 20.0000 mg | ORAL_TABLET | Freq: Two times a day (BID) | ORAL | 0 refills | Status: AC

## 2019-04-06 MED ORDER — DIPHENHYDRAMINE HCL 12.5 MG/5ML PO ELIX
ORAL_SOLUTION | ORAL | Status: AC
  Filled 2019-04-06: qty 10

## 2019-04-06 MED ORDER — ALUM & MAG HYDROXIDE-SIMETH 200-200-20 MG/5ML PO SUSP
30.0000 mL | Freq: Once | ORAL | Status: AC
  Administered 2019-04-06: 30 mL via ORAL

## 2019-04-06 MED ORDER — DOCUSATE SODIUM 100 MG PO CAPS: 100.0000 mg | ORAL_CAPSULE | Freq: Every day | ORAL | 0 refills | Status: AC | PRN

## 2019-04-06 MED ORDER — POLYETHYLENE GLYCOL 3350 17 GM/SCOOP PO POWD: 17.0000 g | Freq: Every day | ORAL | 0 refills | Status: AC

## 2019-04-06 MED ORDER — DICYCLOMINE HCL 10 MG/5ML PO SOLN: 10.0000 mg | Freq: Once | ORAL | Status: DC

## 2019-04-06 MED ORDER — ALUM & MAG HYDROXIDE-SIMETH 200-200-20 MG/5ML PO SUSP
ORAL | Status: AC
  Filled 2019-04-06: qty 30

## 2019-04-06 NOTE — ED Triage Notes (Signed)
Pt states she has sharp abdominal pains this started on Monday. Pt states the pains radiates into her back.

## 2019-04-06 NOTE — ED Provider Notes (Addendum)
[facility]    CSN: [id number] Arrival date & time: 04/06/19  1024      History   Chief Complaint Chief Complaint  Patient presents with  . Abdominal Pain    HPI Sharon Shaw is a 29 y.o. female.   HPI  Patient with a history of GERD presents today with 3 days of abdominal pain.  Associated symptoms include bloating, sensation of feeling overly gassy, and poor appetite.  She denies any fever or chills.  Denies dysuria. Patient's last menstrual period was 04/05/2019.  Reports a several year history of irregularity of bowels.  She has bowel movements on average every 4 to 5 days.  Last bowel movement was yesterday.  She reports that normal stool for prolonged periods of time straining to have a bowel movement.  She has not attempted relief with any over-the-counter stool softeners or laxative preparations. She has not previously been worked up by GI. Past Medical History:  Diagnosis Date  . Chronic tonsillitis 05/2011   mother denies sx. of sleep apnea  . Eczema   . GERD (gastroesophageal reflux disease)    Tried prilosec 5 yrs ago, no relief  . PVCs (premature ventricular contractions)     Patient Active Problem List   Diagnosis Date Noted  . Dizziness 09/23/2018  . Leg pain 03/23/2014  . PVC's (premature ventricular contractions) 05/26/2011    Past Surgical History:  Procedure Laterality Date  . TONSILLECTOMY AND ADENOIDECTOMY  06/02/2011   Procedure: TONSILLECTOMY AND ADENOIDECTOMY;  Surgeon: Flo Shanks, MD;  Location:  [facility];  Service: ENT;  Laterality: N/A;  tonsilectomy and adenoid abliteration  . WISDOM TOOTH EXTRACTION      OB History   No obstetric history on file.      Home Medications    Prior to Admission medications   Medication Sig Start Date End Date Taking? Authorizing Provider  diltiazem (CARDIZEM) 30 MG tablet TAKE 1 TABLET BY MOUTH TWICE DAILY AS NEEDED 09/23/18   Rollene Rotunda, MD  flecainide (TAMBOCOR) 50  MG tablet TAKE 1 TABLET TWICE DAILY AS NEEDED 09/23/18   Rollene Rotunda, MD    Family History Family History  Problem Relation Age of Onset  . Hypertension Maternal Grandmother   . Cancer Maternal Grandfather   . Sudden death Cousin        maternal cousin had mental retardation, cardiomyopathy and sudden death    Social History Social History   Tobacco Use  . Smoking status: Never Smoker  . Smokeless tobacco: Never Used  Substance Use Topics  . Alcohol use: Yes    Alcohol/week: 0.0 standard drinks    Comment: occasionally  . Drug use: No     Allergies   Metoprolol tartrate   Review of Systems Review of Systems Pertinent negatives listed in HPI Physical Exam Triage Vital Signs ED Triage Vitals  Enc Vitals Group     BP 04/06/19 1113 113/68     Pulse Rate 04/06/19 1113 100     Resp 04/06/19 1113 16     Temp 04/06/19 1113 98.7 F (37.1 C)     Temp Source 04/06/19 1113 Oral     SpO2 04/06/19 1113 100 %     Weight 04/06/19 1111 185 lb 6.4 oz (84.1 kg)     Height --      Head Circumference --      Peak Flow --      Pain Score 04/06/19 1111 6     Pain Loc --  Pain Edu? --      Excl. in [place]? --    No data found.  Updated Vital Signs BP 113/68 (BP Location: Right Arm)   Pulse 100   Temp 98.7 F (37.1 C) (Oral)   Resp 16   Wt 185 lb 6.4 oz (84.1 kg)   LMP 04/05/2019   SpO2 100%   BMI 28.61 kg/m   Visual Acuity Right Eye Distance:   Left Eye Distance:   Bilateral Distance:    Right Eye Near:   Left Eye Near:    Bilateral Near:     Physical Exam Constitutional:      General: She is not in acute distress.    Appearance: She is not ill-appearing.  HENT:     Head: Normocephalic.  Cardiovascular:     Rate and Rhythm: Normal rate and regular rhythm.  Pulmonary:     Effort: Pulmonary effort is normal.     Breath sounds: Normal breath sounds.  Abdominal:     General: Bowel sounds are decreased. There is distension.     Tenderness: There is  generalized abdominal tenderness.  Skin:    General: Skin is warm and dry.  Neurological:     Mental Status: She is alert and oriented to person, place, and time.      UC Treatments / Results  Labs (all labs ordered are listed, but only abnormal results are displayed) Labs Reviewed  URINE CULTURE  POC URINE PREG, ED    EKG   Radiology No results found.  Procedures Procedures (including critical care time)  Medications Ordered in UC Medications - No data to display  Initial Impression / Assessment and Plan / UC Course  I have reviewed the triage vital signs and the nursing notes.  Pertinent labs & imaging results that were available during my care of the patient were reviewed by me and considered in my medical decision making (see chart for details).     Slow transit constipation -Start MiraLAX once daily until able to reduce stool burden -Start a daily stool softener with Colace.  Take every night.  -Encourage drinking 6 to 8 ounces of water daily to prevent constipation.  Generalized abdominal pain -Abdominal exam reassuring.  Abdominal pain is occurring in a generalized nonspecific pattern. -Encouraged to follow-up with primary care as symptoms are concerning for possible IBS given that they have been going on intermittently for over 3 years. -GI cocktail administered here in clinic with mild improvement. -Trial Dicyclomine BID with meals to reduce abdominal pain symptoms   -Advised that reduction of stool burden should reduce or resolve abdominal pain. -Want to obtain a UA to rule out infection and wanted to also obtain an abdominal series however patient was unable to produce urine during her encounter today.  Advised if symptoms worsen or do not improve to go immediately to the ER.  Abdominal bloating -Encouraged treatment to relieve constipation-like symptoms which hopefully will improve symptoms of bloating.   Avoid spicy foods and foods that irritate the  gut. Final Clinical Impressions(s) / UC Diagnoses   Final diagnoses:  Slow transit constipation  Generalized abdominal pain  Abdominal bloating     Discharge Instructions     For constipation I have prescribed you MiraLAX 1 scoop of powder taily as needed to facilitate a bowel movement.  I have also prescribed you Colace 1 tablet at bedtime to facilitate daily bowel movements. Continue drinking 6-8 eight ounce glasses of water per day.    ED  Prescriptions    Medication Sig Dispense Auth. Provider   dicyclomine (BENTYL) 20 MG tablet Take 1 tablet (20 mg total) by mouth 2 (two) times daily. 20 tablet Bing Neighbors, FNP   polyethylene glycol powder (MIRALAX) 17 GM/SCOOP powder Take 17 g by mouth daily. 255 g Bing Neighbors, FNP   docusate sodium (COLACE) 100 MG capsule Take 1 capsule (100 mg total) by mouth daily as needed for mild constipation (at bedtime). 60 capsule Bing Neighbors, FNP     PDMP not reviewed this encounter.   Bing Neighbors, FNP 04/06/19 1303    Bing Neighbors, FNP 04/06/19 1304

## 2019-04-06 NOTE — ED Notes (Signed)
Pt states unable to provide urine sample. Gave cup of ice water to drink and encourage try to give sample.

## 2019-04-06 NOTE — Discharge Instructions (Signed)
For constipation I have prescribed you MiraLAX 1 scoop of powder taily as needed to facilitate a bowel movement.  I have also prescribed you Colace 1 tablet at bedtime to facilitate daily bowel movements. Continue drinking 6-8 eight ounce glasses of water per day. Since you are unable to urinate we cannot perform the imaging that I recommended therefore symptoms worsen or do not improve I recommended she go immediately to the ER.  Follow-up with Dr. Earlene Plater at [facility] at [facility] to establish care as a new patient.

## 2019-04-09 ENCOUNTER — Emergency Department (HOSPITAL_COMMUNITY)
Admission: EM | Admit: 2019-04-09 | Discharge: 2019-04-09 | Disposition: A | Discharge status: Home / Self Care | Payer: BLUE CROSS/BLUE SHIELD | Attending: Emergency Medicine | Admitting: Emergency Medicine

## 2019-04-09 ENCOUNTER — Emergency Department (HOSPITAL_COMMUNITY): Payer: BLUE CROSS/BLUE SHIELD

## 2019-04-09 ENCOUNTER — Other Ambulatory Visit: Payer: Self-pay

## 2019-04-09 ENCOUNTER — Encounter (HOSPITAL_COMMUNITY): Payer: Self-pay | Admitting: Emergency Medicine

## 2019-04-09 DIAGNOSIS — R1012 Left upper quadrant pain: Secondary | ICD-10-CM | POA: Diagnosis present

## 2019-04-09 DIAGNOSIS — J189 Pneumonia, unspecified organism: Secondary | ICD-10-CM | POA: Diagnosis not present

## 2019-04-09 DIAGNOSIS — Z20822 Contact with and (suspected) exposure to covid-19: Secondary | ICD-10-CM | POA: Insufficient documentation

## 2019-04-09 DIAGNOSIS — R0789 Other chest pain: Secondary | ICD-10-CM | POA: Insufficient documentation

## 2019-04-09 DIAGNOSIS — Z79899 Other long term (current) drug therapy: Secondary | ICD-10-CM | POA: Insufficient documentation

## 2019-04-09 LAB — CBC
HCT: 35.2 % — ABNORMAL LOW (ref 36.0–46.0)
Hemoglobin: 11.5 g/dL — ABNORMAL LOW (ref 12.0–15.0)
MCH: 31.6 pg (ref 26.0–34.0)
MCHC: 32.7 g/dL (ref 30.0–36.0)
MCV: 96.7 fL (ref 80.0–100.0)
Platelets: 350 10*3/uL (ref 150–400)
RBC: 3.64 MIL/uL — ABNORMAL LOW (ref 3.87–5.11)
RDW: 14.2 % (ref 11.5–15.5)
WBC: 2.6 10*3/uL — ABNORMAL LOW (ref 4.0–10.5)
nRBC: 0 % (ref 0.0–0.2)

## 2019-04-09 LAB — URINALYSIS, ROUTINE W REFLEX MICROSCOPIC
Bacteria, UA: NONE SEEN
Bilirubin Urine: NEGATIVE
Glucose, UA: NEGATIVE mg/dL
Ketones, ur: 20 mg/dL — AB
Leukocytes,Ua: NEGATIVE
Nitrite: NEGATIVE
Protein, ur: NEGATIVE mg/dL
Specific Gravity, Urine: 1.014 (ref 1.005–1.030)
pH: 6 (ref 5.0–8.0)

## 2019-04-09 LAB — TROPONIN I (HIGH SENSITIVITY)
Troponin I (High Sensitivity): 3 ng/L (ref <18)
Troponin I (High Sensitivity): 3 ng/L (ref <18)

## 2019-04-09 LAB — COMPREHENSIVE METABOLIC PANEL
ALT: 12 U/L (ref 0–44)
AST: 18 U/L (ref 15–41)
Albumin: 3.9 g/dL (ref 3.5–5.0)
Alkaline Phosphatase: 47 U/L (ref 38–126)
Anion gap: 10 (ref 5–15)
BUN: 6 mg/dL (ref 6–20)
CO2: 25 mmol/L (ref 22–32)
Calcium: 9.4 mg/dL (ref 8.9–10.3)
Chloride: 102 mmol/L (ref 98–111)
Creatinine, Ser: 0.88 mg/dL (ref 0.44–1.00)
GFR calc Af Amer: >60 mL/min (ref >60)
GFR calc non Af Amer: >60 mL/min (ref >60)
Glucose, Bld: 90 mg/dL (ref 70–99)
Potassium: 3.7 mmol/L (ref 3.5–5.1)
Sodium: 137 mmol/L (ref 135–145)
Total Bilirubin: 0.5 mg/dL (ref 0.3–1.2)
Total Protein: 7.6 g/dL (ref 6.5–8.1)

## 2019-04-09 LAB — I-STAT BETA HCG BLOOD, ED (MC, WL, AP ONLY): I-stat hCG, quantitative: <5 m[IU]/mL (ref <5)

## 2019-04-09 LAB — LIPASE, BLOOD: Lipase: 26 U/L (ref 11–51)

## 2019-04-09 LAB — POC SARS CORONAVIRUS 2 AG -  ED: SARS Coronavirus 2 Ag: NEGATIVE

## 2019-04-09 MED ORDER — FAMOTIDINE IN NACL 20-0.9 MG/50ML-% IV SOLN
20.0000 mg | Freq: Once | INTRAVENOUS | Status: AC
  Administered 2019-04-09: 20 mg via INTRAVENOUS
  Filled 2019-04-09: qty 50

## 2019-04-09 MED ORDER — IOHEXOL 300 MG/ML  SOLN
100.0000 mL | Freq: Once | INTRAMUSCULAR | Status: AC | PRN
  Administered 2019-04-09: 100 mL via INTRAVENOUS

## 2019-04-09 MED ORDER — ALUM & MAG HYDROXIDE-SIMETH 200-200-20 MG/5ML PO SUSP
30.0000 mL | Freq: Once | ORAL | Status: AC
  Administered 2019-04-09: 19:00:00 30 mL via ORAL
  Filled 2019-04-09: qty 30

## 2019-04-09 MED ORDER — LIDOCAINE VISCOUS HCL 2 % MT SOLN
15.0000 mL | Freq: Once | OROMUCOSAL | Status: AC
  Administered 2019-04-09: 15 mL via ORAL
  Filled 2019-04-09: qty 15

## 2019-04-09 MED ORDER — SODIUM CHLORIDE 0.9% FLUSH: 3.0000 mL | Freq: Once | INTRAVENOUS | Status: DC

## 2019-04-09 MED ORDER — AZITHROMYCIN 250 MG PO TABS: 250.0000 mg | ORAL_TABLET | Freq: Every day | ORAL | 0 refills | Status: DC

## 2019-04-09 MED ORDER — LACTATED RINGERS IV BOLUS
1000.0000 mL | Freq: Once | INTRAVENOUS | Status: AC
  Administered 2019-04-09: 19:00:00 1000 mL via INTRAVENOUS

## 2019-04-09 NOTE — ED Triage Notes (Signed)
Reports abd pain since Monday.  Went to [facility] on Wednesday and told she was constipated.  Taking Miralax and Colace with gas relief but still hasn't had BM.  Reports pain to center of chest since Thursday with SOB.  Denies nausea and vomiting.  Last BM on Wednesday.

## 2019-04-09 NOTE — Discharge Instructions (Signed)
The CAT scan today showed that your bowels look normal but you may have diarrhea from the MiraLAX in the next few days.  It also showed concern for pneumonia in the left side of your lower lung which may be what is causing your abdominal pain.  You were started on an antibiotic for this however if you start running high fevers develop shortness of breath or have any concerns you can return to the emergency room.  Otherwise it is very important for you to follow-up with a doctor to make sure all these findings resolve after the antibiotics.

## 2019-04-09 NOTE — ED Provider Notes (Signed)
[facility] EMERGENCY DEPARTMENT Provider Note   CSN: [id number] Arrival date & time: 04/09/19  1521     History Chief Complaint  Patient presents with  . Constipation  . Abdominal Pain  . Chest Pain    Sharon Shaw is a 29 y.o. female.  Patient is a 29 year old female with prior history of GERD who presents today with abdominal pain for the last 6 days.  Also she has not had a bowel movement since Monday.  The abdominal pain is in the left upper quadrant and 5 out of 10 and more constant.  It does not seem to be worse with anything.  Patient was seen at urgent care on Wednesday and told she was constipated and given MiraLAX.  She has taken 5 doses and still not had a bowel movement.  She has continued to eat but states she is eating less things like eggs, oatmeal and toast.  She denies any urinary symptoms.  No vaginal symptoms including no discharge or bleeding.  Since Thursday she has had a burning sensation in the center of her chest which is worse when she lays down and makes it difficult to take a deep breath.  Also when she lays down it causes her to cough.  She denies any fever.  No known Covid contacts and she states she has been at home and has not been around anybody.  No prior abdominal surgeries.  Other than the MiraLAX she was given at urgent care she does not take any other medications including over the counter medications such as NSAIDs, aspirin products, alcohol, tobacco or drugs.  The history is provided by the patient.  Constipation Severity:  Severe Associated symptoms: abdominal pain   Abdominal Pain Associated symptoms: chest pain and constipation   Chest Pain Associated symptoms: abdominal pain        Past Medical History:  Diagnosis Date  . Chronic tonsillitis 05/2011   mother denies sx. of sleep apnea  . Eczema   . GERD (gastroesophageal reflux disease)    Tried prilosec 5 yrs ago, no relief  . PVCs (premature ventricular  contractions)     Patient Active Problem List   Diagnosis Date Noted  . Dizziness 09/23/2018  . Leg pain 03/23/2014  . PVC's (premature ventricular contractions) 05/26/2011    Past Surgical History:  Procedure Laterality Date  . TONSILLECTOMY AND ADENOIDECTOMY  06/02/2011   Procedure: TONSILLECTOMY AND ADENOIDECTOMY;  Surgeon: Jodi Marble, MD;  Location: [facility];  Service: ENT;  Laterality: N/A;  tonsilectomy and adenoid abliteration  . WISDOM TOOTH EXTRACTION       OB History   No obstetric history on file.     Family History  Problem Relation Age of Onset  . Hypertension Maternal Grandmother   . Cancer Maternal Grandfather   . Sudden death Cousin        maternal cousin had mental retardation, cardiomyopathy and sudden death    Social History   Tobacco Use  . Smoking status: Never Smoker  . Smokeless tobacco: Never Used  Substance Use Topics  . Alcohol use: Yes    Alcohol/week: 0.0 standard drinks    Comment: occasionally  . Drug use: No    Home Medications Prior to Admission medications   Medication Sig Start Date End Date Taking? Authorizing Provider  dicyclomine (BENTYL) 20 MG tablet Take 1 tablet (20 mg total) by mouth 2 (two) times daily. 04/06/19   Scot Jun, FNP  diltiazem [place])  30 MG tablet TAKE 1 TABLET BY MOUTH TWICE DAILY AS NEEDED 09/23/18   Rollene Rotunda, MD  docusate sodium (COLACE) 100 MG capsule Take 1 capsule (100 mg total) by mouth daily as needed for mild constipation (at bedtime). 04/06/19   Bing Neighbors, FNP  flecainide (TAMBOCOR) 50 MG tablet TAKE 1 TABLET TWICE DAILY AS NEEDED 09/23/18   Rollene Rotunda, MD  polyethylene glycol powder (MIRALAX) 17 GM/SCOOP powder Take 17 g by mouth daily. 04/06/19   Bing Neighbors, FNP    Allergies    Metoprolol tartrate  Review of Systems   Review of Systems  Cardiovascular: Positive for chest pain.  Gastrointestinal: Positive for abdominal pain and constipation.   All other systems reviewed and are negative.   Physical Exam Updated Vital Signs BP 120/82   Pulse 85   Temp 98.6 F (37 C) (Oral)   Resp 17   LMP 04/05/2019   SpO2 100%   Physical Exam Vitals and nursing note reviewed.  Constitutional:      General: She is not in acute distress.    Appearance: She is well-developed and normal weight.  HENT:     Head: Normocephalic and atraumatic.  Eyes:     Pupils: Pupils are equal, round, and reactive to light.  Cardiovascular:     Rate and Rhythm: Normal rate and regular rhythm.     Heart sounds: Normal heart sounds. No murmur. No friction rub.  Pulmonary:     Effort: Pulmonary effort is normal.     Breath sounds: Normal breath sounds. No wheezing or rales.  Abdominal:     General: Bowel sounds are normal. There is no distension.     Palpations: Abdomen is soft.     Tenderness: There is abdominal tenderness in the left upper quadrant. There is no right CVA tenderness, left CVA tenderness, guarding or rebound.  Musculoskeletal:        General: No tenderness. Normal range of motion.     Comments: No edema  Skin:    General: Skin is warm and dry.     Findings: No rash.  Neurological:     Mental Status: She is alert and oriented to person, place, and time.     Cranial Nerves: No cranial nerve deficit.  Psychiatric:        Behavior: Behavior normal.     ED Results / Procedures / Treatments   Labs (all labs ordered are listed, but only abnormal results are displayed) Labs Reviewed  CBC - Abnormal; Notable for the following components:      Result Value   WBC 2.6 (*)    RBC 3.64 (*)    Hemoglobin 11.5 (*)    HCT 35.2 (*)    All other components within normal limits  URINALYSIS, ROUTINE W REFLEX MICROSCOPIC - Abnormal; Notable for the following components:   Hgb urine dipstick SMALL (*)    Ketones, ur 20 (*)    All other components within normal limits  LIPASE, BLOOD  COMPREHENSIVE METABOLIC PANEL  I-STAT BETA HCG BLOOD, ED  ([facility], [facility], [facility] ONLY)  POC SARS CORONAVIRUS 2 AG -  ED  TROPONIN I (HIGH SENSITIVITY)  TROPONIN I (HIGH SENSITIVITY)    EKG EKG Interpretation  Date/Time:  Saturday April 09 2019 15:41:03 EST Ventricular Rate:  86 PR Interval:  134 QRS Duration: 74 QT Interval:  326 QTC Calculation: 390 R Axis:   64 Text Interpretation: Normal sinus rhythm Nonspecific T wave abnormality No significant change since  last tracing Confirmed by Gwyneth Sprout ([id number]) on 04/09/2019 5:16:29 PM   Radiology CT ABDOMEN PELVIS W CONTRAST  Result Date: 04/09/2019 CLINICAL DATA:  29 year old female with left upper quadrant abdominal pain and nausea. EXAM: CT ABDOMEN AND PELVIS WITH CONTRAST TECHNIQUE: Multidetector CT imaging of the abdomen and pelvis was performed using the standard protocol following bolus administration of intravenous contrast. CONTRAST:  OMNIPAQUE IOHEXOL 300 MG/ML  SOLN COMPARISON:  None. FINDINGS: Lower chest: Partially visualized small bilateral pleural effusions, right greater left. There is partial consolidative changes of the lower lobes which may represent atelectasis or infiltrate. Clinical correlation is recommended. No intra-abdominal free air. There is a small free fluid within the posterior pelvis and presacral space. Hepatobiliary: No focal liver abnormality is seen. No gallstones, gallbladder wall thickening, or biliary dilatation. Pancreas: Unremarkable. No pancreatic ductal dilatation or surrounding inflammatory changes. Spleen: Normal in size without focal abnormality. Adrenals/Urinary Tract: The adrenal glands are unremarkable. The kidneys, visualized ureters, and urinary bladder appear unremarkable as well. Stomach/Bowel: There is a small hiatal hernia. There is loose stool throughout the colon compatible with diarrheal state. Correlation with clinical exam and stool cultures recommended. There is no bowel obstruction or active inflammation. The appendix is normal.  Vascular/Lymphatic: The abdominal aorta and IVC are unremarkable. No portal venous gas. There is no adenopathy. Reproductive: The uterus is anteverted and grossly unremarkable. No adnexal masses. Other: There is mild diffuse mesenteric edema. No fluid collection. Musculoskeletal: No acute or significant osseous findings. IMPRESSION: 1. Diarrheal state. Correlation with clinical exam and stool cultures recommended. No bowel obstruction. Normal appendix. 2. Small bilateral pleural effusions with partial consolidative changes of the lower lobes, likely atelectasis or infiltrate. Clinical correlation is recommended. 3. Small amount of fluid in the posterior pelvis and presacral space which may be related to rupture of ovarian follicle. Electronically Signed   By: Elgie Collard M.D.   On: 04/09/2019 19:34   DG Chest Port 1 View  Result Date: 04/09/2019 CLINICAL DATA:  Chest pain. Worsening abdominal pain. EXAM: PORTABLE CHEST 1 VIEW COMPARISON:  Lung bases from abdominal CT earlier today. FINDINGS: Small bilateral pleural effusions. Associated streaky opacities at the lung bases. Upper lungs are clear. Heart is normal in size with normal mediastinal contours. No pulmonary edema. No pneumothorax. No acute osseous abnormalities are seen. Mild broad-based rightward curvature of the spine. IMPRESSION: Small bilateral pleural effusions. Associated streaky opacities at the lung bases, atelectasis versus pneumonia. Electronically Signed   By: Narda Rutherford M.D.   On: 04/09/2019 20:40    Procedures Procedures (including critical care time)  Medications Ordered in ED Medications  sodium chloride flush (NS) 0.9 % injection 3 mL (has no administration in time range)  alum & mag hydroxide-simeth (MAALOX/MYLANTA) 200-200-20 MG/5ML suspension 30 mL (has no administration in time range)    And  lidocaine (XYLOCAINE) 2 % viscous mouth solution 15 mL (has no administration in time range)  famotidine (PEPCID) IVPB 20  mg premix (has no administration in time range)    ED Course  I have reviewed the triage vital signs and the nursing notes.  Pertinent labs & imaging results that were available during my care of the patient were reviewed by me and considered in my medical decision making (see chart for details).    MDM Rules/Calculators/A&P                      Young healthy female presenting today with ongoing abdominal pain and  now chest pain.  Chest pain seems more atypical and related to GI then cardiac or respiratory.  Patient's EKG without acute findings.  She had a troponin done while waiting which was 3 and that is with 6 days of discomfort.  CBC with mild leukopenia of 2.6 but normal hemoglobin, CMP within normal limits and lipase within normal limits.  Patient's UA does have some hemoglobin and ketones but otherwise looks normal.  Given patient's ongoing pain, inability to have a bowel movement despite multiple rounds of MiraLAX we will do a CT to ensure no other acute findings.  She is passing gas at this time.  Lower suspicion for obstruction as patient is passing flatus and she does have positive bowel sounds.  Lower suspicion for appendicitis, ovarian or urinary source.  Patient given a GI cocktail and Pepcid.  CT pending.  9:06 PM CT of abd/pelvis shows a diarrheal state but most likely from the miralax.  She has been constipated.  Appendix is normal.  CT showed small bilateral pleural effusions with partial consolidative change especial in the LLL which is where she is having pain.  No risk factors for PE and given pt is having some cough will treat for PNA.  Covid was neg.  Pt given azithro but also encouraged to follow up with PCP to make sure symptoms resolve and CXR clears.  Final Clinical Impression(s) / ED Diagnoses Final diagnoses:  Community acquired pneumonia of left lower lobe of lung    Rx / DC Orders ED Discharge Orders         Ordered    azithromycin (ZITHROMAX) 250 MG tablet   Daily     04/09/19 2110           Gwyneth Sprout, MD 04/09/19 2110

## 2019-04-09 NOTE — ED Notes (Signed)
Patient verbalizes understanding of discharge instructions. Opportunity for questioning and answers were provided. Armband removed by staff, pt discharged from ED.  

## 2019-04-09 NOTE — ED Notes (Signed)
Patient transported to CT 

## 2019-08-10 ENCOUNTER — Telehealth: Payer: Self-pay | Admitting: Cardiology

## 2019-08-10 NOTE — Telephone Encounter (Signed)
LVM to schedule f/u with Hochrein from recall lists

## 2019-09-27 ENCOUNTER — Telehealth: Payer: Self-pay | Admitting: *Deleted

## 2019-09-27 NOTE — Telephone Encounter (Signed)
A message was left, re: her follow up visit. 

## 2020-07-29 ENCOUNTER — Other Ambulatory Visit: Payer: Self-pay

## 2020-07-29 ENCOUNTER — Encounter (HOSPITAL_COMMUNITY): Payer: Self-pay

## 2020-07-29 ENCOUNTER — Emergency Department (HOSPITAL_COMMUNITY)
Admission: EM | Admit: 2020-07-29 | Discharge: 2020-07-29 | Disposition: A | Discharge status: Home / Self Care | Payer: BLUE CROSS/BLUE SHIELD | Attending: Emergency Medicine | Admitting: Emergency Medicine

## 2020-07-29 DIAGNOSIS — R110 Nausea: Secondary | ICD-10-CM | POA: Insufficient documentation

## 2020-07-29 DIAGNOSIS — Y92410 Unspecified street and highway as the place of occurrence of the external cause: Secondary | ICD-10-CM | POA: Insufficient documentation

## 2020-07-29 DIAGNOSIS — R519 Headache, unspecified: Secondary | ICD-10-CM | POA: Diagnosis not present

## 2020-07-29 MED ORDER — ONDANSETRON HCL 4 MG PO TABS: 4.0000 mg | ORAL_TABLET | Freq: Four times a day (QID) | ORAL | 0 refills | Status: AC
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
## 2020-07-29 NOTE — Discharge Instructions (Addendum)
Return for any problem.   Use Ibuprofen (Motin) for muscular pain - 600mg  every 8 hours.   Use Acetaminophen (Tylenol) for additional pain - 1000mg  every 8 hours.   Use Zofran as need for nausea.

## 2021-03-06 ENCOUNTER — Encounter (HOSPITAL_BASED_OUTPATIENT_CLINIC_OR_DEPARTMENT_OTHER): Payer: Self-pay

## 2021-03-06 ENCOUNTER — Emergency Department (HOSPITAL_BASED_OUTPATIENT_CLINIC_OR_DEPARTMENT_OTHER)
Admission: EM | Admit: 2021-03-06 | Discharge: 2021-03-06 | Disposition: A | Discharge status: Home / Self Care | Payer: PRIVATE HEALTH INSURANCE | Attending: Emergency Medicine | Admitting: Emergency Medicine

## 2021-03-06 ENCOUNTER — Other Ambulatory Visit: Payer: Self-pay

## 2021-03-06 DIAGNOSIS — K5641 Fecal impaction: Secondary | ICD-10-CM | POA: Diagnosis not present

## 2021-03-06 DIAGNOSIS — K5900 Constipation, unspecified: Secondary | ICD-10-CM | POA: Diagnosis present

## 2021-03-06 MED ORDER — BISACODYL 5 MG PO TBEC
5.0000 mg | DELAYED_RELEASE_TABLET | Freq: Once | ORAL | Status: AC
  Administered 2021-03-06: 5 mg via ORAL
  Filled 2021-03-06: qty 1

## 2021-03-06 NOTE — ED Notes (Signed)
Patient passed a large amount of stool, reports relief of all abdominal pain

## 2021-03-06 NOTE — ED Provider Notes (Signed)
[facility] EMERGENCY DEPT Provider Note   CSN: [id number] Arrival date & time: 03/06/21  0044     History  Chief complaint: Constipation  Sharon Shaw is a 31 y.o. female.  The history is provided by the patient.  She has history of GERD, PVCs and comes in complaining of inability to have a bowel movement.  Last bowel movement was 4 days ago.  She felt like she had to move her bowels tonight and sat on the commode for 2 hours without being able to pass anything.  She states that it is not unusual for her to go 4 to 5 days between bowel movements.  She denies abdominal pain, nausea, vomiting.  She has not taken anything at home to try to get her bowels to move.   Home Medications Prior to Admission medications   Medication Sig Start Date End Date Taking? Authorizing Provider  azithromycin (ZITHROMAX) 250 MG tablet Take 1 tablet (250 mg total) by mouth daily. Take first 2 tablets together, then 1 every day until finished. 04/09/19   Gwyneth Sprout, MD  dicyclomine (BENTYL) 20 MG tablet Take 1 tablet (20 mg total) by mouth 2 (two) times daily. 04/06/19   Bing Neighbors, FNP  diltiazem (CARDIZEM) 30 MG tablet TAKE 1 TABLET BY MOUTH TWICE DAILY AS NEEDED 09/23/18   Rollene Rotunda, MD  docusate sodium (COLACE) 100 MG capsule Take 1 capsule (100 mg total) by mouth daily as needed for mild constipation (at bedtime). 04/06/19   Bing Neighbors, FNP  flecainide (TAMBOCOR) 50 MG tablet TAKE 1 TABLET TWICE DAILY AS NEEDED 09/23/18   Rollene Rotunda, MD  ondansetron (ZOFRAN) 4 MG tablet Take 1 tablet (4 mg total) by mouth every 6 (six) hours. 07/29/20   Wynetta Fines, MD  polyethylene glycol powder (MIRALAX) 17 GM/SCOOP powder Take 17 g by mouth daily. 04/06/19   Bing Neighbors, FNP      Allergies    Metoprolol tartrate    Review of Systems   Review of Systems  All other systems reviewed and are negative.  Physical Exam Updated Vital Signs BP 120/80    Pulse 88     Temp 97.7 F (36.5 C) (Oral)    Resp 17    Ht 5\' 7"  (1.702 m)    Wt 83 kg    LMP 02/14/2021    SpO2 100%    BMI 28.66 kg/m  Physical Exam Vitals and nursing note reviewed.  31 year old female, resting comfortably and in no acute distress. Vital signs are normal. Oxygen saturation is 100%, which is normal. Head is normocephalic and atraumatic. PERRLA, EOMI. Oropharynx is clear. Neck is nontender and supple without adenopathy or JVD. Back is nontender and there is no CVA tenderness. Lungs are clear without rales, wheezes, or rhonchi. Chest is nontender. Heart has regular rate and rhythm without murmur. Abdomen is soft, flat, nontender without masses or hepatosplenomegaly and peristalsis is normoactive. Rectal: Normal sphincter tone.  Moderately hard fecal impaction present. Extremities have no cyanosis or edema, full range of motion is present. Skin is warm and dry without rash. Neurologic: Mental status is normal, cranial nerves are intact, moves all extremities equally.  ED Results / Procedures / Treatments   Labs (all labs ordered are listed, but only abnormal results are displayed) Labs Reviewed - No data to display  EKG None  Radiology No results found.  Procedures Procedures    Medications Ordered in ED Medications  bisacodyl (DULCOLAX) EC tablet  5 mg (5 mg Oral Given 03/06/21 0444)    ED Course/ Medical Decision Making/ A&P                           Medical Decision Making  Constipation with fecal impaction.  Disimpaction was attempted, patient unable to tolerated.  We will try soapsuds enema.  Old records are reviewed, and she does have a prior urgent care visit for constipation.  I suspect that she has underlying irritable bowel syndrome with constipation.  She did not have a bowel movement with the soapsuds enema.  She is given a dose of bisacodyl and is discharged with instructions to use fiber supplements and polyethylene glycol to try to regulate her bowels.   Occasional use of bisacodyl is acceptable.  Also recommended she discuss with her primary care provider whether linaclotide would be appropriate for her.        Final Clinical Impression(s) / ED Diagnoses Final diagnoses:  Fecal impaction in rectum (HCC)  Constipation, unspecified constipation type    Rx / DC Orders ED Discharge Orders     None         Dione Booze, MD 03/06/21 [phone]

## 2021-03-06 NOTE — ED Triage Notes (Signed)
States patient has been trying to have a BM for the past two hours prior to arrival without success.  Last BM was a "few" days ago.

## 2021-03-06 NOTE — ED Notes (Signed)
Pt reports attempting to self disimpact at home with no relief. Pt denies new medication usage or chronic use of narcotics. Pt did not try enemas or stool softeners at home.

## 2021-03-06 NOTE — Discharge Instructions (Signed)
Please take fiber supplements.  You know that you are not getting enough fiber in your diet if your stool sinks when you have a bowel movement.  You may need a lot of fiber to get better bowel function.  You may take polyethylene glycol (MiraLAX) as needed to help you have bowel movements.  Polyethylene glycol is safe to use every day.  I  f you ever are having problems having a bowel movement in spite of using fiber and polyethylene glycol, you may take bisacodyl on an occasional basis.  Bisacodyl is not safe to use on a regular basis.  Consider discussing with your primary care provider whether you should take linaclotide (Linzess) to treat your chronic constipation.

## 2022-04-27 ENCOUNTER — Emergency Department (HOSPITAL_COMMUNITY): Payer: PRIVATE HEALTH INSURANCE

## 2022-04-27 ENCOUNTER — Other Ambulatory Visit: Payer: Self-pay

## 2022-04-27 ENCOUNTER — Encounter (HOSPITAL_COMMUNITY): Payer: Self-pay

## 2022-04-27 ENCOUNTER — Emergency Department (HOSPITAL_COMMUNITY)
Admission: EM | Admit: 2022-04-27 | Discharge: 2022-04-27 | Disposition: A | Discharge status: Home / Self Care | Payer: PRIVATE HEALTH INSURANCE | Attending: Emergency Medicine | Admitting: Emergency Medicine

## 2022-04-27 DIAGNOSIS — S8992XA Unspecified injury of left lower leg, initial encounter: Secondary | ICD-10-CM | POA: Insufficient documentation

## 2022-04-27 DIAGNOSIS — X509XXA Other and unspecified overexertion or strenuous movements or postures, initial encounter: Secondary | ICD-10-CM | POA: Insufficient documentation

## 2022-04-27 DIAGNOSIS — Y9302 Activity, running: Secondary | ICD-10-CM | POA: Diagnosis not present

## 2022-04-27 MED ORDER — KETOROLAC TROMETHAMINE 15 MG/ML IJ SOLN
15.0000 mg | Freq: Once | INTRAMUSCULAR | Status: AC
  Administered 2022-04-27: 15 mg via INTRAVENOUS
  Filled 2022-04-27: qty 1

## 2022-04-27 MED ORDER — OXYCODONE HCL 5 MG PO TABS
5.0000 mg | ORAL_TABLET | Freq: Once | ORAL | Status: AC
  Administered 2022-04-27: 5 mg via ORAL
  Filled 2022-04-27: qty 1

## 2022-04-27 MED ORDER — ACETAMINOPHEN 500 MG PO TABS
1000.0000 mg | ORAL_TABLET | Freq: Once | ORAL | Status: AC
  Administered 2022-04-27: 1000 mg via ORAL
  Filled 2022-04-27: qty 2

## 2022-04-27 NOTE — ED Provider Notes (Signed)
[place] EMERGENCY DEPARTMENT AT [facility] Provider Note   CSN: [id number] Arrival date & time: 04/27/22  2051     History {Add pertinent medical, surgical, social history, OB history to HPI:1} Chief Complaint  Patient presents with   Knee Injury    Pt bib ems for knee pain/injury. Pt playing kickball and heard a pop in her left knee, swelling noted. 22 G L hand given 150 mcg by EMS, 7/10 pain. VSS    Sharon Shaw is a 32 y.o. female with *** presents with ***.   HPI     Home Medications Prior to Admission medications   Medication Sig Start Date End Date Taking? Authorizing Provider  dicyclomine (BENTYL) 20 MG tablet Take 1 tablet (20 mg total) by mouth 2 (two) times daily. 04/06/19   Scot Jun, NP  diltiazem (CARDIZEM) 30 MG tablet TAKE 1 TABLET BY MOUTH TWICE DAILY AS NEEDED 09/23/18   Minus Breeding, MD  docusate sodium (COLACE) 100 MG capsule Take 1 capsule (100 mg total) by mouth daily as needed for mild constipation (at bedtime). 04/06/19   Scot Jun, NP  flecainide (TAMBOCOR) 50 MG tablet TAKE 1 TABLET TWICE DAILY AS NEEDED 09/23/18   Minus Breeding, MD  ondansetron (ZOFRAN) 4 MG tablet Take 1 tablet (4 mg total) by mouth every 6 (six) hours. 07/29/20   Valarie Merino, MD  polyethylene glycol powder (MIRALAX) 17 GM/SCOOP powder Take 17 g by mouth daily. 04/06/19   Scot Jun, NP      Allergies    Metoprolol tartrate    Review of Systems   Review of Systems Review of systems {pos/neg:18640::"Negative","Positive"} for ***.  A 10 point review of systems was performed and is negative unless otherwise reported in HPI.  Physical Exam Updated Vital Signs BP 123/80 (BP Location: Right Arm)   Pulse 86   Temp 98.4 F (36.9 C) (Oral)   Resp 18   Ht 5\' 7"  (1.702 m)   Wt 92.5 kg   SpO2 100%   BMI 31.95 kg/m  Physical Exam General: Normal appearing {Desc; female/female:11659}, lying in bed.  HEENT: PERRLA, Sclera anicteric,  MMM, trachea midline.  Cardiology: RRR, no murmurs/rubs/gallops. BL radial and DP pulses equal bilaterally.  Resp: Normal respiratory rate and effort. CTAB, no wheezes, rhonchi, crackles.  Abd: Soft, non-tender, non-distended. No rebound tenderness or guarding.  GU: Deferred. MSK: No peripheral edema or signs of trauma. Extremities without deformity or TTP. No cyanosis or clubbing. Skin: warm, dry. No rashes or lesions. Back: No CVA tenderness Neuro: A&Ox4, CNs II-XII grossly intact. MAEs. Sensation grossly intact.  Psych: Normal mood and affect.   ED Results / Procedures / Treatments   Labs (all labs ordered are listed, but only abnormal results are displayed) Labs Reviewed - No data to display  EKG None  Radiology No results found.  Procedures Procedures  {Document cardiac monitor, telemetry assessment procedure when appropriate:1}  Medications Ordered in ED Medications - No data to display  ED Course/ Medical Decision Making/ A&P                          Medical Decision Making Amount and/or Complexity of Data Reviewed Radiology: ordered. Decision-making details documented in ED Course.  Risk OTC drugs. Prescription drug management.    This patient presents to the ED for concern of ***, this involves an extensive number of treatment options, and is a complaint that carries with it  a high risk of complications and morbidity.  I considered the following differential and admission for this acute, potentially life threatening condition.   MDM:    ***  Clinical Course as of 04/27/22 2208  Sun Apr 27, 2022  2204 DG Knee Complete 4 Views Left IMPRESSION: 1. No acute fracture or dislocation. 2. Moderate joint effusion.   [HN]    Clinical Course User Index [HN] Audley Hose, MD    Labs: I Ordered, and personally interpreted labs.  The pertinent results include:  ***  Imaging Studies ordered: I ordered imaging studies including *** I independently  visualized and interpreted imaging. I agree with the radiologist interpretation  Additional history obtained from ***.  External records from outside source obtained and reviewed including ***  Cardiac Monitoring: The patient was maintained on a cardiac monitor.  I personally viewed and interpreted the cardiac monitored which showed an underlying rhythm of: ***  Reevaluation: After the interventions noted above, I reevaluated the patient and found that they have :{resolved/improved/worsened:23923::"improved"}  Social Determinants of Health: ***  Disposition:  ***  Co morbidities that complicate the patient evaluation  Past Medical History:  Diagnosis Date   Chronic tonsillitis 05/2011   mother denies sx. of sleep apnea   Eczema    GERD (gastroesophageal reflux disease)    Tried prilosec 5 yrs ago, no relief   PVCs (premature ventricular contractions)      Medicines No orders of the defined types were placed in this encounter.   I have reviewed the patients home medicines and have made adjustments as needed  Problem List / ED Course: Problem List Items Addressed This Visit   None        {Document critical care time when appropriate:1} {Document review of labs and clinical decision tools ie heart score, Chads2Vasc2 etc:1}  {Document your independent review of radiology images, and any outside records:1} {Document your discussion with family members, caretakers, and with consultants:1} {Document social determinants of health affecting pt's care:1} {Document your decision making why or why not admission, treatments were needed:1}  This note was created using dictation software, which may contain spelling or grammatical errors.

## 2022-04-27 NOTE — Discharge Instructions (Signed)
Thank you for coming to [facility] Emergency Department. You were seen for knee pain. We did an exam, and imaging, and these showed Likely a knee sprain. You may have a ligamentous or tendon injury which was not diagnosed here today. You can follow up with orthopedic surgery by calling them in the morning to make an appointment. You can alternate taking Tylenol and ibuprofen as needed for pain. You can take 650mg  tylenol (acetaminophen) every 4-6 hours, and 600 mg ibuprofen 3 times a day. Rest, ice, and elevate the knee to help prevent swelling. You can use the knee immobilizer and crutches.   Do not hesitate to return to the ED or call 911 if you experience: -Worsening symptoms -Numbness/tingling -Redness, swelling, fevers/chills -Lightheadedness, passing out -Anything else that concerns you
